# Patient Record
Sex: Male | Born: 1935 | Race: White | Hispanic: No | Marital: Married | Smoking: Former smoker
Health system: Southern US, Community
[De-identification: ages and names within clinical notes are randomized; demographics above are authoritative.]

## PROBLEM LIST (undated history)

## (undated) DIAGNOSIS — F419 Anxiety disorder, unspecified: Secondary | ICD-10-CM

## (undated) DIAGNOSIS — H409 Unspecified glaucoma: Secondary | ICD-10-CM

## (undated) DIAGNOSIS — G510 Bell's palsy: Secondary | ICD-10-CM

## (undated) DIAGNOSIS — I10 Essential (primary) hypertension: Secondary | ICD-10-CM

## (undated) DIAGNOSIS — R310 Gross hematuria: Secondary | ICD-10-CM

## (undated) DIAGNOSIS — F32A Depression, unspecified: Secondary | ICD-10-CM

## (undated) DIAGNOSIS — N401 Enlarged prostate with lower urinary tract symptoms: Secondary | ICD-10-CM

## (undated) DIAGNOSIS — I219 Acute myocardial infarction, unspecified: Secondary | ICD-10-CM

## (undated) DIAGNOSIS — I4891 Unspecified atrial fibrillation: Secondary | ICD-10-CM

## (undated) DIAGNOSIS — K219 Gastro-esophageal reflux disease without esophagitis: Secondary | ICD-10-CM

## (undated) DIAGNOSIS — M4800 Spinal stenosis, site unspecified: Secondary | ICD-10-CM

## (undated) DIAGNOSIS — K449 Diaphragmatic hernia without obstruction or gangrene: Secondary | ICD-10-CM

## (undated) DIAGNOSIS — F329 Major depressive disorder, single episode, unspecified: Secondary | ICD-10-CM

## (undated) HISTORY — DX: Major depressive disorder, single episode, unspecified: F32.9

## (undated) HISTORY — DX: Unspecified atrial fibrillation: I48.91

## (undated) HISTORY — DX: Essential (primary) hypertension: I10

## (undated) HISTORY — DX: Gross hematuria: R31.0

## (undated) HISTORY — DX: Unspecified glaucoma: H40.9

## (undated) HISTORY — DX: Acute myocardial infarction, unspecified: I21.9

## (undated) HISTORY — PX: CYSTOSCOPY WITH FULGERATION: SHX6638

## (undated) HISTORY — PX: TOTAL HIP ARTHROPLASTY: SHX124

## (undated) HISTORY — DX: Bell's palsy: G51.0

## (undated) HISTORY — DX: Benign prostatic hyperplasia with lower urinary tract symptoms: N40.1

## (undated) HISTORY — DX: Gastro-esophageal reflux disease without esophagitis: K21.9

## (undated) HISTORY — DX: Diaphragmatic hernia without obstruction or gangrene: K44.9

## (undated) HISTORY — PX: TONSILLECTOMY: SUR1361

## (undated) HISTORY — PX: BACK SURGERY: SHX140

## (undated) HISTORY — DX: Depression, unspecified: F32.A

## (undated) HISTORY — PX: HEMORRHOID SURGERY: SHX153

## (undated) HISTORY — DX: Spinal stenosis, site unspecified: M48.00

## (undated) HISTORY — DX: Anxiety disorder, unspecified: F41.9

---

## 2006-03-03 ENCOUNTER — Ambulatory Visit (HOSPITAL_COMMUNITY): Admission: RE | Admit: 2006-03-03 | Discharge: 2006-03-03 | Payer: Self-pay | Admitting: Internal Medicine

## 2006-06-04 ENCOUNTER — Encounter: Admission: RE | Admit: 2006-06-04 | Discharge: 2006-06-04 | Payer: Self-pay | Admitting: Nephrology

## 2006-06-11 ENCOUNTER — Encounter: Admission: RE | Admit: 2006-06-11 | Discharge: 2006-06-11 | Payer: Self-pay | Admitting: Nephrology

## 2007-04-17 ENCOUNTER — Ambulatory Visit: Payer: Self-pay | Admitting: Internal Medicine

## 2007-04-30 ENCOUNTER — Encounter: Payer: Self-pay | Admitting: Internal Medicine

## 2007-04-30 ENCOUNTER — Ambulatory Visit: Payer: Self-pay | Admitting: Internal Medicine

## 2007-07-17 ENCOUNTER — Encounter: Admission: RE | Admit: 2007-07-17 | Discharge: 2007-07-17 | Payer: Self-pay | Admitting: Orthopedic Surgery

## 2007-10-08 ENCOUNTER — Inpatient Hospital Stay (HOSPITAL_COMMUNITY): Admission: RE | Admit: 2007-10-08 | Discharge: 2007-10-10 | Payer: Self-pay | Admitting: Specialist

## 2008-02-17 ENCOUNTER — Ambulatory Visit: Payer: Self-pay | Admitting: Oncology

## 2008-02-19 DIAGNOSIS — K644 Residual hemorrhoidal skin tags: Secondary | ICD-10-CM | POA: Insufficient documentation

## 2008-02-19 DIAGNOSIS — F411 Generalized anxiety disorder: Secondary | ICD-10-CM | POA: Insufficient documentation

## 2008-02-19 DIAGNOSIS — K648 Other hemorrhoids: Secondary | ICD-10-CM | POA: Insufficient documentation

## 2008-02-19 DIAGNOSIS — I2510 Atherosclerotic heart disease of native coronary artery without angina pectoris: Secondary | ICD-10-CM | POA: Insufficient documentation

## 2008-02-19 DIAGNOSIS — Z860100 Personal history of colon polyps, unspecified: Secondary | ICD-10-CM | POA: Insufficient documentation

## 2008-02-19 DIAGNOSIS — K5730 Diverticulosis of large intestine without perforation or abscess without bleeding: Secondary | ICD-10-CM | POA: Insufficient documentation

## 2008-02-19 LAB — CBC WITH DIFFERENTIAL/PLATELET
BASO%: 0.4 % (ref 0.0–2.0)
Eosinophils Absolute: 0.2 10*3/uL (ref 0.0–0.5)
MCHC: 34.7 g/dL (ref 32.0–35.9)
MCV: 93.4 fL (ref 81.6–98.0)
MONO%: 7 % (ref 0.0–13.0)
NEUT#: 2.8 10*3/uL (ref 1.5–6.5)
RBC: 3.26 10*6/uL — ABNORMAL LOW (ref 4.20–5.71)
RDW: 14.8 % — ABNORMAL HIGH (ref 11.2–14.6)
WBC: 4.3 10*3/uL (ref 4.0–10.0)

## 2008-02-19 LAB — MORPHOLOGY

## 2008-02-19 LAB — CHCC SMEAR

## 2008-02-19 LAB — ERYTHROCYTE SEDIMENTATION RATE: Sed Rate: 37 mm/hr — ABNORMAL HIGH (ref 0–20)

## 2008-02-24 LAB — IRON AND TIBC
TIBC: 323 ug/dL (ref 215–435)
UIBC: 222 ug/dL

## 2008-02-24 LAB — IMMUNOFIXATION ELECTROPHORESIS
IgG (Immunoglobin G), Serum: 767 mg/dL (ref 694–1618)
IgM, Serum: 228 mg/dL (ref 60–263)
Total Protein, Serum Electrophoresis: 6.5 g/dL (ref 6.0–8.3)

## 2008-02-24 LAB — COMPREHENSIVE METABOLIC PANEL
ALT: 27 U/L (ref 0–53)
AST: 21 U/L (ref 0–37)
Albumin: 4.2 g/dL (ref 3.5–5.2)
Alkaline Phosphatase: 68 U/L (ref 39–117)
BUN: 24 mg/dL — ABNORMAL HIGH (ref 6–23)
Chloride: 103 mEq/L (ref 96–112)
Potassium: 4.4 mEq/L (ref 3.5–5.3)

## 2008-02-24 LAB — FERRITIN: Ferritin: 177 ng/mL (ref 22–322)

## 2008-03-28 LAB — CBC WITH DIFFERENTIAL/PLATELET
Basophils Absolute: 0 10*3/uL (ref 0.0–0.1)
Eosinophils Absolute: 0.2 10*3/uL (ref 0.0–0.5)
HGB: 10.8 g/dL — ABNORMAL LOW (ref 13.0–17.1)
MONO#: 0.3 10*3/uL (ref 0.1–0.9)
NEUT#: 2.8 10*3/uL (ref 1.5–6.5)
RDW: 16.2 % — ABNORMAL HIGH (ref 11.2–14.6)
WBC: 4.5 10*3/uL (ref 4.0–10.0)
lymph#: 1.2 10*3/uL (ref 0.9–3.3)

## 2008-03-28 LAB — COMPREHENSIVE METABOLIC PANEL
AST: 24 U/L (ref 0–37)
Albumin: 4.4 g/dL (ref 3.5–5.2)
BUN: 27 mg/dL — ABNORMAL HIGH (ref 6–23)
Calcium: 9.1 mg/dL (ref 8.4–10.5)
Chloride: 104 mEq/L (ref 96–112)
Glucose, Bld: 72 mg/dL (ref 70–99)
Potassium: 4.7 mEq/L (ref 3.5–5.3)
Total Protein: 7 g/dL (ref 6.0–8.3)

## 2008-04-25 ENCOUNTER — Encounter: Admission: RE | Admit: 2008-04-25 | Discharge: 2008-04-25 | Payer: Self-pay | Admitting: Orthopedic Surgery

## 2008-06-29 ENCOUNTER — Inpatient Hospital Stay (HOSPITAL_COMMUNITY): Admission: RE | Admit: 2008-06-29 | Discharge: 2008-07-05 | Payer: Self-pay | Admitting: Orthopedic Surgery

## 2008-08-05 ENCOUNTER — Ambulatory Visit: Payer: Self-pay | Admitting: Oncology

## 2008-08-09 LAB — COMPREHENSIVE METABOLIC PANEL
ALT: 21 U/L (ref 0–53)
Alkaline Phosphatase: 68 U/L (ref 39–117)
BUN: 13 mg/dL (ref 6–23)
Potassium: 4.4 mEq/L (ref 3.5–5.3)
Sodium: 138 mEq/L (ref 135–145)

## 2008-08-09 LAB — CBC WITH DIFFERENTIAL/PLATELET
Eosinophils Absolute: 0.1 10*3/uL (ref 0.0–0.5)
HCT: 33.9 % — ABNORMAL LOW (ref 38.7–49.9)
HGB: 11.8 g/dL — ABNORMAL LOW (ref 13.0–17.1)
MCHC: 34.8 g/dL (ref 32.0–35.9)
NEUT%: 61.2 % (ref 40.0–75.0)
RBC: 3.55 10*6/uL — ABNORMAL LOW (ref 4.20–5.71)

## 2008-08-09 LAB — LACTATE DEHYDROGENASE: LDH: 124 U/L (ref 94–250)

## 2008-09-23 IMAGING — CR DG HIP 1V PORT*L*
1 series · 1 of 1 positions shown · non-contrast
Comparison: 06/21/2008

CLINICAL DATA: Osteoarthritis of the left hip and.

PORTABLE PELVIS
,

[view not recorded]
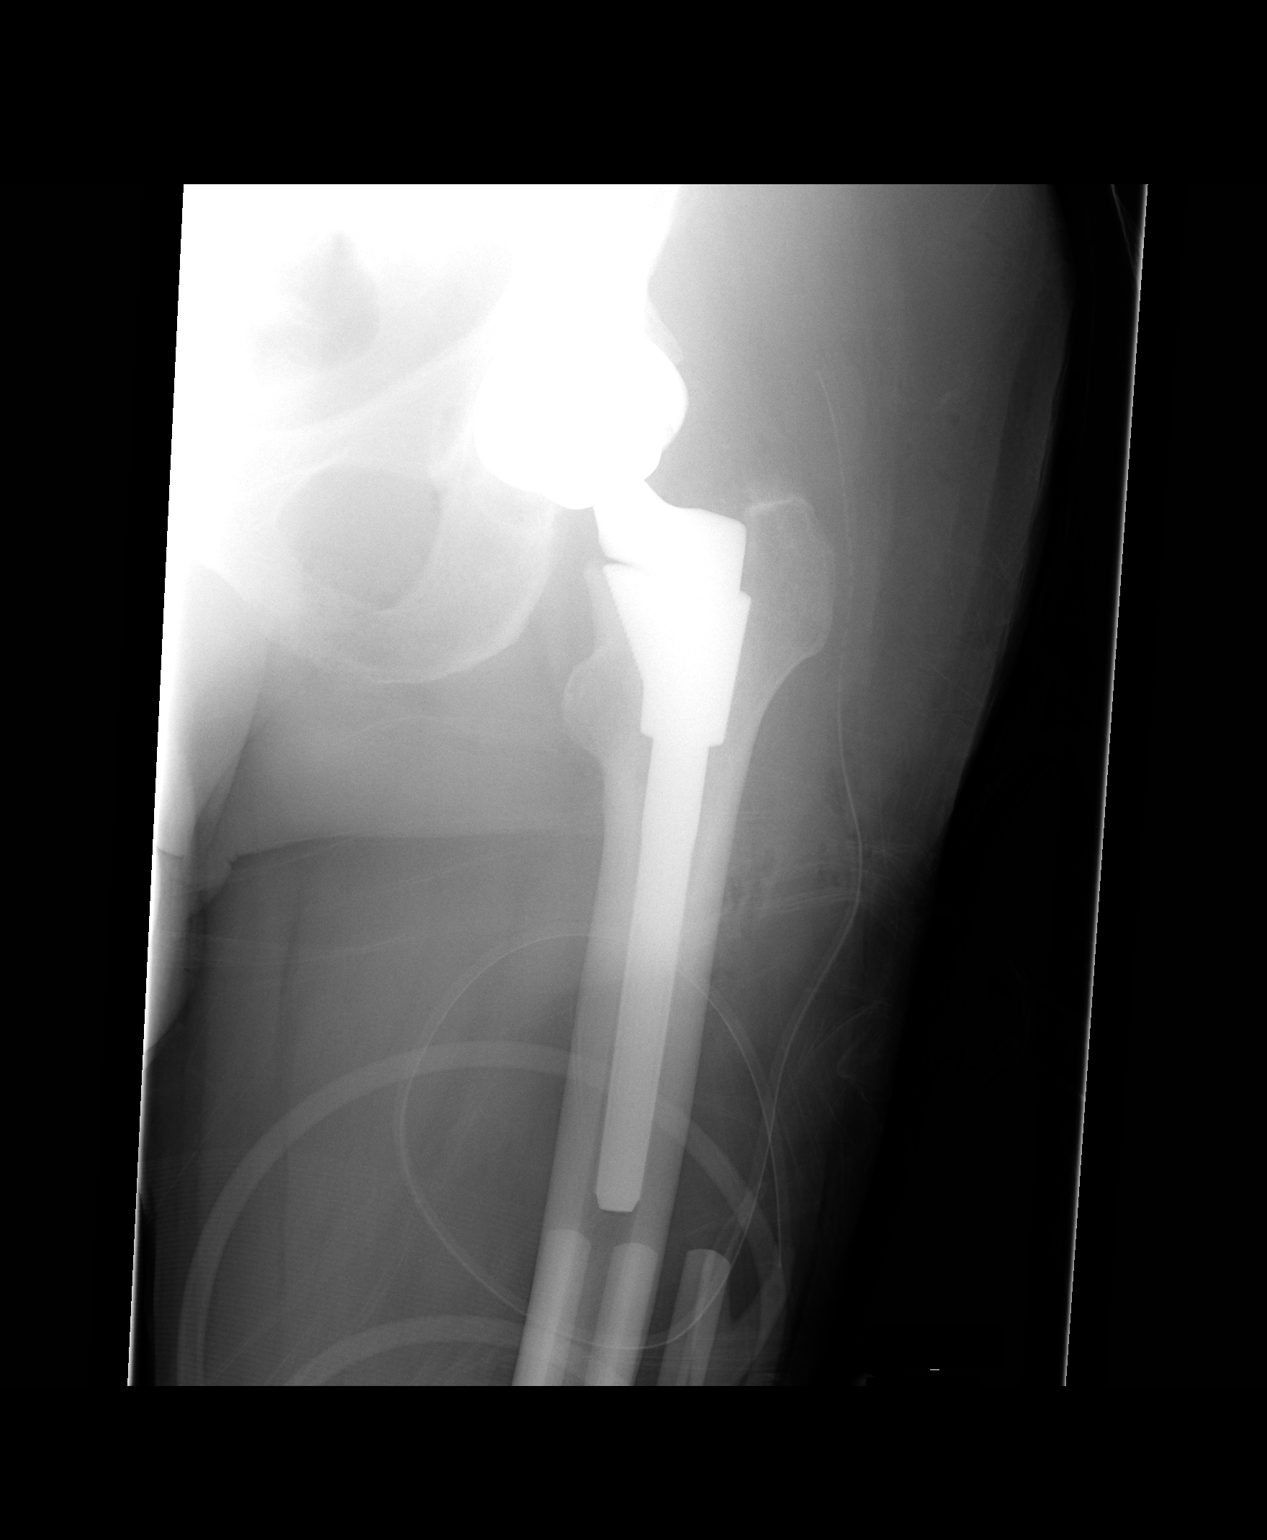

[1 of 1 positions shown; findings below may reference images not displayed]

FINDINGS: The patient has undergone left total hip prosthesis
insertion.  The acetabular and femoral components appear in good
position in the AP projection.  The tip of the stem is not visible
on this radiograph.  Soft tissue drain is noted.

The there has been no change in the appearance of the right total
hip prosthesis.
IMPRESSION: Satisfactory postoperative appearance of the left hip after total
hip replacement.

PORTABLE LEFT HIP - 1 VIEW
FINDINGS: A single AP portable view of the left hip demonstrates
the acetabular and femoral components of the left total hip
prosthesis including the tip of the stem to be in good position.
Soft tissue drain is noted.
IMPRESSION: Satisfactory postoperative appearance of the left hip after total
hip replacement.  (AP projection only).

## 2009-02-02 ENCOUNTER — Ambulatory Visit: Payer: Self-pay | Admitting: Oncology

## 2009-02-14 LAB — LACTATE DEHYDROGENASE: LDH: 131 U/L (ref 94–250)

## 2009-02-14 LAB — CBC WITH DIFFERENTIAL/PLATELET
BASO%: 0.5 % (ref 0.0–2.0)
Basophils Absolute: 0 10*3/uL (ref 0.0–0.1)
Eosinophils Absolute: 0.2 10*3/uL (ref 0.0–0.5)
MCHC: 34.6 g/dL (ref 32.0–36.0)
MONO%: 7.1 % (ref 0.0–14.0)
NEUT%: 59.7 % (ref 39.0–75.0)
Platelets: 110 10*3/uL — ABNORMAL LOW (ref 140–400)
WBC: 4.2 10*3/uL (ref 4.0–10.3)

## 2009-02-14 LAB — COMPREHENSIVE METABOLIC PANEL
ALT: 29 U/L (ref 0–53)
AST: 27 U/L (ref 0–37)
Albumin: 4.6 g/dL (ref 3.5–5.2)
BUN: 16 mg/dL (ref 6–23)
CO2: 25 mEq/L (ref 19–32)
Chloride: 105 mEq/L (ref 96–112)
Glucose, Bld: 87 mg/dL (ref 70–99)
Potassium: 4.4 mEq/L (ref 3.5–5.3)
Total Protein: 6.8 g/dL (ref 6.0–8.3)

## 2010-08-04 ENCOUNTER — Emergency Department (HOSPITAL_COMMUNITY): Admission: EM | Admit: 2010-08-04 | Discharge: 2010-08-04 | Payer: Self-pay | Admitting: Emergency Medicine

## 2010-11-27 ENCOUNTER — Ambulatory Visit (HOSPITAL_BASED_OUTPATIENT_CLINIC_OR_DEPARTMENT_OTHER)
Admission: RE | Admit: 2010-11-27 | Discharge: 2010-11-27 | Disposition: A | Discharge status: Home / Self Care | Payer: Medicare Other | Source: Ambulatory Visit | Attending: Urology | Admitting: Urology

## 2010-11-27 ENCOUNTER — Ambulatory Visit (HOSPITAL_COMMUNITY): Payer: Medicare Other

## 2010-11-27 ENCOUNTER — Other Ambulatory Visit: Payer: Self-pay | Admitting: Urology

## 2010-11-27 DIAGNOSIS — C674 Malignant neoplasm of posterior wall of bladder: Secondary | ICD-10-CM | POA: Insufficient documentation

## 2010-11-27 LAB — BASIC METABOLIC PANEL
GFR calc Af Amer: 56 mL/min — ABNORMAL LOW (ref >60)
GFR calc non Af Amer: 46 mL/min — ABNORMAL LOW (ref >60)

## 2010-11-27 LAB — CBC
HCT: 39.9 % (ref 39.0–52.0)
MCH: 33.5 pg (ref 26.0–34.0)
MCHC: 34.6 g/dL (ref 30.0–36.0)
MCV: 96.8 fL (ref 78.0–100.0)
RBC: 4.12 MIL/uL — ABNORMAL LOW (ref 4.22–5.81)
WBC: 5.7 10*3/uL (ref 4.0–10.5)

## 2010-12-02 NOTE — Op Note (Signed)
  NAMEJORDY, Omar Khan               ACCOUNT NO.:  [id number]  MEDICAL RECORD NO.:  [id number]         PATIENT TYPE:  HAMB  LOCATION:                               FACILITY:  [facility]  PHYSICIAN:  Courtney Paris, M.D.DATE OF BIRTH:  08-27-1925  DATE OF PROCEDURE:  11/27/2010 DATE OF DISCHARGE:                              OPERATIVE REPORT   PREOPERATIVE DIAGNOSIS:  Papillary bladder tumor, right posterior wall (T1A).  POSTOPERATIVE DIAGNOSIS:  Papillary bladder tumor, right posterior wall (T1A), pending path report.  OPERATION:  Transurethral resection of bladder tumor (3 cm).  ANESTHESIA:  General.  SURGEON:  Courtney Paris, M.D.  BRIEF HISTORY:  This 77 year old patient presented with gross hematuria for the first time in October 2011 and has had intermittent bleeding until January 2012.  Cystoscopy showed a papillary tumor about 2.5-3 cm of the right base and he enters now to have this resected.  CT scan showed normal upper tracts.  He does have bilateral hip prosthesis.  He has been a nonsmoker since 1964.  DESCRIPTION OF PROCEDURE:  The patient was placed on the operating table in dorsal lithotomy position.  After satisfactory induction of general anesthesia, he was prepped and draped with Betadine in the usual sterile fashion.  Time-out was then performed and the patient and the procedure were then reconfirmed.  Because of his bilateral hip prosthesis, the grounding pad for the Bovie was placed on his abdomen.  The 28 continuous flow resectoscope was inserted in the bladder, and the bladder was carefully inspected. The papillary tumor in the right posterior wall was photographed and then resected carefully with the loop.  The chips were evacuated from the bladder and the base was fulgurated.  Picture was taken of the post TURBT site.  The scope was removed, but there was a little bit of bleeding from the prostatic urethra which was quite hyperemic.  An 18  Foley catheter was inserted and the irrigant was clear.  Little bit of oozing coming out around the penis was noted.  Catheter was left to straight drainage and attached to a leg bag.  He was taken to the recovery room in good condition and will be later discharged as an outpatient.     Courtney Paris, M.D.     HMK/MEDQ  D:  11/27/2010  T:  11/27/2010  Job:  [id number]  Electronically Signed by Vic Blackbird M.D. on 11/29/2010 12:57:23 PM

## 2011-01-02 LAB — URINALYSIS, ROUTINE W REFLEX MICROSCOPIC
Glucose, UA: NEGATIVE mg/dL
Ketones, ur: 15 mg/dL — AB

## 2011-01-02 LAB — URINE MICROSCOPIC-ADD ON

## 2011-01-02 LAB — HEMOCCULT GUIAC POC 1CARD (OFFICE): Fecal Occult Bld: NEGATIVE

## 2011-03-05 NOTE — Discharge Summary (Signed)
Omar Khan, Omar Khan               ACCOUNT NO.:  [id number]   MEDICAL RECORD NO.:  [id number]          PATIENT TYPE:  INP   LOCATION:  1610                         FACILITY:  [facility]   PHYSICIAN:  Ollen Gross, M.D.    DATE OF BIRTH:  [birth year]   DATE OF ADMISSION:  06/29/2008  DATE OF DISCHARGE:  07/05/2008                               DISCHARGE SUMMARY   ADMITTING DIAGNOSES:  1. Osteoarthritis, left hip.  2. Anxiety.  3. Shingles.  4. Past history of aspiration pneumonia  5. Hypertension.  6. Coronary arterial disease.  7. History of myocardial infarction in 1985.  8. Hiatal hernia.  9. Reflux disease.  10.Hypercholesterolemia.  11.Hemorrhoids.  12.Past history of transfusion.  13.Spinal stenosis.  14.Childhood illnesses of measles, mumps and rubella.   DISCHARGE DIAGNOSES:  1. Osteoarthritis, left hip status post left total hip replacement      arthroplasty.  2. Acute renal failure postoperative, improved.  3. Postoperative blood loss anemia.  4. Status post transfusion without sequelae.  5. Urinary tract infection.  6. Gross hematuria following catheter removal, improving.  7. Anxiety.  8. Shingles.  9. Past history of aspiration pneumonia  10.Hypertension.  11.Coronary arterial disease.  12.History of myocardial infarction in 1985.  13.Hiatal hernia.  14.Reflux disease.  15.Hypercholesterolemia.  16.Hemorrhoids.  17.Past history of transfusion.  18.Spinal stenosis.  19.Childhood illnesses of measles, mumps and rubella.   PROCEDURE:  June 29, 2008, left total hip, Ollen Gross, M.D. with  assistant Alexzandrew L. Perkins, P.A.C.  Anesthesia, spinal.   CONSULTS:  Kari Baars, M.D., medical.   BRIEF HISTORY:  Mr. Nakama is an 77 year old male with severe end-stage  arthritis of the left hip with progressive worsening pain and  dysfunction, failed nonoperative management who now presents for total  hip arthroplasty.   LABORATORY DATA:  Preop CBC  showed hemoglobin low at 10.6, hematocrit  31.5, white cell count 4, platelets 114.  Chem panel minimally elevated  BUN and creatinine 30 and 1.75.  Remaining chem panel within normal  limits.  Preop UA, small bili, otherwise negative.  Serial CBCs were  followed.  The patient did receive 2 units of blood intraoperatively and  the post hemoglobin was stable at 10.5, but drifted down to 8.3.  He  felt like he needed more blood, given two more units.  It came back 10.1  Last H and H 9.6 and 28.3.  Serial pro time followed per Coumadin  protocol.  Last PT/INR of 33.3 and 3.  Serial BMET were followed.  He  had minimally elevated BUN and creatinine preoperatively.  BUN and  creatinine on postop day #1 was normal, but however, he shot up and his  BUN went up to 29 and then got as high as 33, back down to 20.  His  creatinine went from a normal level of 1.29 up to 3.14.  On postop day  #2, rechecked, it got as high as 3.43, came back down to 3, improving in  went back down to a normal level 1.13 prior to discharge.   Preop chest x-ray dated October 06, 2007, no acute findings.  Postop  chest x-ray July 02, 2008, no infiltrate or congestive heart  failure, questioned confluence of shadows peripherally aspect, right  midlung versus underlying nodule, two-view chest recommended.  Follow-up  portable pelvis and hip, satisfactory appearance of left hip after total  hip replacement.  EKG dated July 01, 2008, normal sinus rhythm, ST  and T-wave abnormality, consider inferior, superior anterior lateral  ischemia and no change since last confirmed by Caryn Bee E. Denton Lank, M.D.  Preop chest x-ray was dated October 07, 2007, normal sinus rhythm, left  ventricular hypertrophy, repolarization abnormality, normal tracing when  compared by Nicki Guadalajara, M.D..   HOSPITAL COURSE:  The patient was admitted to [facility].  He  tolerated the procedure well, later transferred to recovery room and  the  orthopedic floor.  Started on PCA and p.o. analgesic pain control  following surgery.  He did receive 2 units of blood intraoperatively.  Started out with a low hemoglobin.  He was actually doing pretty well on  morning of day one.  He had decent output.  Hemovac drain placed at the  time of surgery was pulled.  Put him on slight precautions because he  had a previous aspiration pneumonia following a previous surgery.  So we  encouraged him to sit upright for meals.  Blood pressure medications  were resumed, placed on parameters.  Added sublingual nitro due to his  history of MI, but that was many years ago.  Started him back on his  home meds, started getting up and walking short distances by day two.  Unfortunately, he had developed some azotemia with increase in BUN and  creatinine.  Kari Baars, M.D. was consulted by Ollen Gross, M.D.  and felt to have some acute renal failure developing, likely prerenal  azotemia with a drop and associated high blood pressure.  Gave him blood  a second time because his hemoglobin was back down on day two so he got  2 more units of blood.  Blood pressure medications were placed on hold  with parameters and given fluids.  His hemoglobin improved following the  blood and went up to 10.1.  His BUN and creatinine got worse initially  and got as high as 30 and 3.4 noted on postop day #2 and 33 and 3 on  postop day #3.  He was seen throughout the weekend and renal function  was monitored.  After postop day #3 though his renal function started to  improve with the management of his hypotension and also fluid  administration.  By postop day #4, his BUN and creatinine was back down  to 27 and 1.7.  He felt like he had turned the corner and  started  getting up with physical therapy.  He was progressing with physical  therapy, but it was felt that he possibly could benefit undergoing a  skilled nursing facility prior to discharge.  From medical  standpoint he  improved through the weekend and on postop day #5 he was doing better.  Hemoglobin was stabilized at 9.6.  Incision looked good.  We got  discharge planning social worker involved to assist with placement of  the patient.  We left his Foley catheter in for several days for strict  monitoring of his input and output.  At the end of the weekend though  his BUN and creatinine had improved and we removed the Foley.  He had  developed some gross hematuria, felt to  be possibly due to a traumatic  cath now that the Foley was removed.  Weekend coverage spoke with the  urologist on-call and suggested monitoring and make sure that it did  improve, if it did not the recommendation was to reinsert the Foley for  irrigation of the bladder.  His gross hematuria did improve though.  The  color improved over the next day or two.  UA was checked and was found  to have positive for beginnings of urinary tract infection.  That was  noted on Tuesday, so we put him on some antibiotics for that.  He  continued to improve.  He was seen on Tuesday by medical services, W.  Buren Kos, M.D. who felt the patient was stable and if a bed was  available to transfer him over.  We put him  antibiotics for UTI.  He  was progressing with therapy and felt to be a good candidate and was  transferred at that time as long as a bed was available.   DISCHARGE/PLAN:  1. The patient will be transferred to a skilled nursing facility of      choice on July 05, 2008.  2. Discharge diagnoses, please see above.  3. Discharge meds, current medications include:      a.     Coumadin protocol although his Coumadin is on hold today of       July 05, 2008, but resuming on July 06, 2008, please       titrate the Coumadin level for target INR between 2 and 3.  He       needs to be on Coumadin for 3 weeks from his total knee protocol.       Again, his Coumadin is on hold today July 05, 2008 because of        his INR is 3, but please titrate the Coumadin level.      b.     Colace 100 mg p.o. b.i.d.      c.     Crestor 20 mg daily.      d.     Labetalol 200 mg p.o. b.i.d. hold if systolic pressure is       less than 140.      e.     Benicar 40 mg p.o. daily hold if systolic pressure is less       than 130.      f.     Hydrochlorothiazide 25 mg daily.      g.     Paxil 20 mg nightly.      h.     Xanax 0.5 mg p.o. nightly.      i.     Prilosec 20 mg daily.      j.     Nu-Iron 150 mg p.o. daily.      k.     Albuterol nebulizers b.i.d.      l.     Percocet 5 mg one or two every 4 hours as needed for pain       p.r.n.      m.     Tylenol 325 one or two every 4-6 hours as needed for pain       except for headache p.r.n. n. Cipro 500 mg p.o. b.i.d. for 7 days.       He was to start it today on July 05, 2008.  4. Diet:  Heart healthy cardiac diet, low cholesterol.  5. Follow-up:  He needs to  follow up with Ollen Gross, M.D. in the      office 2 weeks from the date of surgery.  Please contact the office      at [phone] to arrange appointment time and transfer of the      patient.  6. The patient is a follow-up with W. Buren Kos, M.D. over at      [facility] in 1 week from discharge from the skilled nursing      facility.  Please have the patient follow-up with Dr. Clelia Croft 1 week      after leaving the skilled nursing facility of choice.  7. Activity:  The patient is partial weightbearing 25-50% to the left      lower extremity.  Hip precautions, total hip protocol, gait      training, ambulation, ADLs as per PT and OT.  May start showering      however, do not submerge the incision under water.   DISPOSITION:  Skilled nurse facility awaiting final bed offer.   CONDITION ON DISCHARGE:  Improved.      Alexzandrew L. Perkins, P.A.C.      Ollen Gross, M.D.  Electronically Signed    ALP/MEDQ  D:  07/05/2008  T:  07/05/2008  Job:  [id number]   cc:   Kari Baars, M.D.   Fax: [phone]

## 2011-03-05 NOTE — Consult Note (Signed)
[name], [name]:  [id number]   MEDICAL RECORD NO.:  [id number]          PATIENT TYPE:  INP   LOCATION:  1610                         FACILITY:  [facility]   PHYSICIAN:  Kari Baars, M.D.  DATE OF BIRTH:  December 20, 1924   DATE OF CONSULTATION:  DATE OF DISCHARGE:                                 CONSULTATION   REASON FOR CONSULTATION:  Acute renal failure and hypotension following  a left total hip replacement.   HISTORY OF PRESENT ILLNESS:  [name] is an 77 year old white male with  a history of coronary artery disease status post MI (1985),  hypertension, chronic renal insufficiency with a baseline creatinine of  1.8, and hyperlipidemia who was admitted for an elective left total hip  replacement on September 9 due to severe hip pain related to  osteoarthritis.  He tolerated the procedure well with no initial  perioperative complications.  Over the past 24 hours, he had seen a  decrease in his blood pressure down to 80/57 associated with a decrease  in urine output and increase in his creatinine from 1.29 yesterday to  3.14 today.  In addition, his hemoglobin has dropped from 10.6  preoperatively to 8.3 today.  He denies any chest pain, shortness of  breath, fevers, chills or sweats.  He has not had any apparent bleeding.  His pain has been adequately controlled with morphine PCA and actually  ambulated briefly yesterday.  Of note, the patient does have a baseline  creatinine of 1.8 which has been longstanding and relatively stable.  He  has seen Dr. Briant Cedar due to a brief rise of his creatinine in the past  to above 2.  He was instructed to hold his Diovan HCT for 2 days  preoperatively.  This was restarted postoperatively in addition to his  other home medications.   REVIEW OF SYSTEMS:  All systems reviewed with the patient and are  negative except as in HPI.   PAST MEDICAL HISTORY:  1. Coronary artery disease status post MI in 1985 (abnormal  EKG).  2. Hypertension.  3. Impaired fasting glucose .  4. Hyperlipidemia.  5. Chronic renal insufficiency with baseline creatinine 1.8.  6. Anemia of chronic disease.  7. Bell's palsy on the right.  8. Anxiety.  9. Gastroesophageal reflux disease.  10.Status post right total hip replacement (2003) and oriental.  11.Spinal stenosis status post lumbar laminectomy in December 2008 by      Dr. Shelle Iron.   MEDICATIONS:  1. Crestor 20 mg daily.  2. Labetalol 200 mg b.i.d.  3. Prilosec OTC 20 mg daily.  4. Diovan/HCT 320/25 daily - held on June 27, 2008.  5  Alprazolam 0.5 mg daily.  1. Paxil 20 mg daily.  2. Aspirin 325 mg daily.  3. B12 100 mcg p.o. daily.  4. Fish oil daily.   ALLERGIES:  NO KNOWN DRUG ALLERGIES.   SOCIAL HISTORY:  He is married.  He is retired and a remote smoker.  One  drink per day.   FAMILY HISTORY:  Father died of coronary artery disease.   PHYSICAL EXAMINATION:  VITAL SIGNS:  Temperature 98.6, pulse 88,  respirations 14-16, blood pressure 80-107/49-62, oxygen saturation 95%  and 2 liters.  Ins and outs for the past 24 hours 2254 mL and 750 mL  out.  He has only had 75 mL of urine output recorded since midnight.  He  has only had 75 mL of urine output recorded in the past 8 hours.  GENERAL:  Pale appearing, but pleasant, in no acute distress.  HEENT:  He has a chronic right facial droop.  Pupils are symmetric.  Oropharynx is moist.  NECK:  Supple without lymphadenopathy, JVD or carotid bruits.  HEART:  Regular rate and rhythm without murmurs, rubs or gallops.  LUNGS:  Clear to auscultation bilaterally.  ABDOMEN:  Soft, nondistended, nontender with normoactive bowel sounds.  EXTREMITIES:  Trace left lower extremity edema.  STDs in place.  Left  hip incision is dressed with no apparent bleeding.   LABORATORY DATA:  White count of 5.6, hemoglobin 8.3, platelets 89,  preop hemoglobin was 10.6 on September 1.  B-met shows a sodium 138,  potassium 4.1,  chloride 107, bicarb 26, BUN 27, creatinine 3.1, glucose  135.  Preop BUN and creatinine were 30 and 1.75, respectively.  INR 1.4.   ASSESSMENT/PLAN:  1. Acute renal failure.  This is likely due to prerenal azotemia/ATN      due to hypotension and acute blood loss in the setting of his      antihypertensive therapy with ACE inhibitors and HCT.  Will hold      all his antihypertensive medications and allow his blood pressure      to recover with a goal of around 134 adequate renal perfusion.  I      agree with IV fluid bolus and transfusion which has been ordered by      Orthopedics.  Will monitor his ins and outs strictly and hope for      recovery of his renal function as his blood pressure improves.  I      doubt he has had cardiac event, but will obtain an EKG and rule out      myocardial infarction.  2. Hypotension.  Likely due to acute blood loss and pain medications      as well as his antihypertensive therapy.  At home, he tends to run      on the lower side of normal between 100 and 110.  Will hold his      Labetalol and Diovan/HCTZ for now and resume his blood labetalol      first as his blood pressure recovers.  Once his renal function has      returned to normal, we will restart his ARB and      hydrochlorothiazide.  As stated, his blood pressure goal will be      around 130/80 at this point.  3. Anemia.  Multifactorial due to anemia of chronic disease      preoperatively and acute blood loss anemia.  His preop anemia is      likely related to his chronic renal insufficiency.  Will add an      iron supplement and Aranesp per pharmacy protocol to promote      recovery to above 10.  4. Thrombocytopenia.  Postoperative drop in platelets.  Note, he is      not on Lovenox or heparin.  This is likely related to blood loss.      Will monitor.  5. Status post left total hip  replacement.  Postoperative management      per Orthopedics.  He is on Coumadin and SCDs for DVT prophylaxis       per their protocol.   DISPOSITION:  I anticipate he will be able to be discharged home once #1  and #2 are stable.   I appreciate the opportunity to participate in the care of Mr. [name].  We will follow his medical issues closely during this  hospitalization and help with transition home.  Appreciate Orthopedics  management.      Kari Baars, M.D.  Electronically Signed     WS/MEDQ  D:  07/01/2008  T:  07/01/2008  Job:  [id number]   cc:   Dyke Maes, M.D.  Fax: [phone]   Ollen Gross, M.D.  Fax: [phone]

## 2011-03-05 NOTE — Op Note (Signed)
NAMEJEVANTE, HOLLIBAUGH               ACCOUNT NO.:  [id number]   MEDICAL RECORD NO.:  [id number]          PATIENT TYPE:  INP   LOCATION:  NA                           [facility]ACILITY[facility]   PHYSICIAN:  Ollen Gross, M.D.    DATE OF BIRTH:  [birth year]   DATE OF PROCEDURE:  06/29/2008  DATE OF DISCHARGE:                               OPERATIVE REPORT   PREOPERATIVE DIAGNOSIS:  Osteoarthritis left hip.   POSTOPERATIVE DIAGNOSIS:  Osteoarthritis left hip.   PROCEDURE:  Left total hip arthroplasty.   SURGEON:  Ollen Gross, M.D.   ASSISTANT:  Avel Peace PA-C.   ANESTHESIA:  Spinal.   BLOOD LOSS:  400.   DRAIN:  Hemovac x1.   COMPLICATIONS:  None.   CONDITION:  Stable to recovery.   CLINICAL NOTE:  Mr. Kucher is an 77 year old male who has severe end-  stage osteoarthritis of the left hip with progressively worsening pain  and dysfunction.  He has failed nonoperative management and presents now  for total hip arthroplasty.   PROCEDURE IN DETAIL:  After the successful administration of spinal  anesthetic, the patient was placed in the right lateral decubitus  position with the left side up and held with the hip positioner.  The  left lower extremity was isolated from his perineum with plastic drapes  and prepped and draped in the usual sterile fashion.  A short  posterolateral incision was made with a 10 blade through the  subcutaneous tissue to the level of fascia lata which was incised in  line with the skin incision.  Sciatic nerve was palpated and protected  and the short external rotators isolated off the femur.  Capsulectomy  was performed and the hip was dislocated.  The center of the femoral  head was marked and the trial prosthesis was placed such that the center  of the trial head corresponds to the center of his native femoral head.  Osteotomy lines marked on the femoral neck and osteotomy made with an  oscillating saw.  Femoral head was removed and the femur  retracted  anteriorly to gain acetabular exposure.   Acetabular retractors were placed and labrum and osteophytes removed.  Reaming starts at 45 mm, coursing increments of 2-53 mm, then a 54-mm  pinnacle acetabular shell was placed in anatomic position and transfixed  with a dome screw.  A Trial 36-mm neutral +4 liner was placed.   The femur was prepared with the canal finder and irrigation.  Axial  reaming was performed to 13.5 mm, proximal reaming to an 64F and the  sleeve machine to a large.  An 64F large trial sleeve was placed and an  18 x 13 stem and a 36+ 8 neck about 10 degrees beyond native  anteversion.  A 36+ 0 head was placed.  The hip was reduced with great  stability.  There was full extension and full external rotation, 70  degrees of flexion, 40 degrees adduction and about 70 degrees internal  rotation and 90 degrees of flexion, 70 degrees of internal rotation.  By  placing the left leg on top  of the right, it felt as though the leg  lengths were equal.  The hip was then dislocated and trials removed.  The permanent apex hole eliminator was placed in the acetabular shell  and the permanent 36 mm neutral +4 marathon liner was placed.  On the  femoral side, we placed the 45F large sleeve with the 18 x 13 stem and a  36+ 8 neck about 10 degrees beyond native anteversion.  A 36+ 0 head was  placed and the hip was reduced with the same stability parameters.  The  wound was copiously irrigated with saline solution and the short  rotators reattached to the femur through drill holes.  The fascia lata  was closed over a Hemovac drain with interrupted #1 Vicryl, subcu closed  with #1 and #2-0 Vicryl and subcuticular running 4-0 Monocryl.  The  incisions were cleaned and dried and Steri-Strips and a bulky sterile  dressing applied.  He is placed into a knee immobilizer, awakened and  transported to recovery in stable condition.      Ollen Gross, M.D.  Electronically  Signed     FA/MEDQ  D:  06/29/2008  T:  06/30/2008  Job:  [id number]

## 2011-03-05 NOTE — H&P (Signed)
Omar Khan, BUCHHEIT NO.:  [id number]   MEDICAL RECORD NO.:  [id number]          PATIENT TYPE:  INP   LOCATION:  NA                           FACILITY:  [facility]   PHYSICIAN:  Ollen Gross, M.D.    DATE OF BIRTH:  [birth year]   DATE OF ADMISSION:  06/29/2008  DATE OF DISCHARGE:                              HISTORY & PHYSICAL   DATE OF OFFICE VISIT/HISTORY AND PHYSICAL:  May 26, 2008   CHIEF COMPLAINT:  Left hip pain.   HISTORY OF PRESENT ILLNESS:  The patient is an 77 year old male who has  been seen by Dr. Lequita Halt for ongoing left hip pain. He is noted to have  end-stage arthritis that is progressively getting worse.  It is felt he  would benefit from undergoing surgical intervention.  Risks and benefits  discussed.  The patient is subsequently admitted to the hospital.  The  patient has been seen preoperatively by Dr. Clelia Croft and felt to be stable  for surgery.   ALLERGIES:  None.   CURRENT MEDICATIONS:  Labetalol Crestor, Diovan, paroxetine, alprazolam,  hydrocodone, B12, enteric-coated aspirin, chromium, Metamucil,  omeprazole, fish oil.   PAST MEDICAL HISTORY:  1. Anxiety.  2. Shingles.  3. Past history of aspiration pneumonia following a previous right      total hip.  4. Hypertension.  5. Coronary arterial disease.  6. History of myocardial infarction 1985.  7. Hiatal hernia.  8. Reflux disease.  9. Hypercholesterolemia.  10.Hemorrhoids.  11.Past history of transfusion.  12.Spinal stenosis.  13.Childhood illnesses to include measles, mumps and rubella.   PAST SURGICAL HISTORY:  Tonsils, adenoids.  He has had four cardiac  catheterizations, hemorrhoid surgery, right hip replacement in November  2003, which he unfortunately sustained a postop aspiration pneumonia and  was in the ICU, and also back surgery.   SOCIAL HISTORY:  Married, retired Airline pilot.  Past smoker for about 20  years, quit about 45 years ago.  Alcohol:  2 ounces of alcohol  before  dinner. Two children.   FAMILY:  Wife will be assisting with his care after surgery.   FAMILY HISTORY:  Father deceased age 70 secondary to heart.  Mother  deceased age 51, unknown causes.  Sister deceased age 2, lung cancer.   REVIEW OF SYSTEMS:  GENERAL:  No fevers, chills, night sweats.  NEURO:  He does have a little bit of hearing loss in the right ear.  No  seizures, syncope or paralysis.  RESPIRATORY: No shortness breath, cough  or hemoptysis.  CARDIOVASCULAR:  No chest pain, no orthopnea.  GI: No  nausea, vomiting, diarrhea or constipation.  GU: Little bit of nocturia.  No dysuria, hematuria.  MUSCULOSKELETAL:  Hip pain.   PHYSICAL EXAM:  VITAL SIGNS:  Pulse 60, respirations 14, blood pressure  100/52.  GENERAL:  77 year old white male, short-statured, in no acute distress.  He is alert, oriented and cooperative.  HEENT: Normocephalic, atraumatic.  Pupils are round and reactive.  EOMs  intact.  Little bit of very slight drooping on the right face noted  preoperatively.  NECK:  Supple.  CHEST: Clear.  HEART: Regular rate and rhythm with a 2/6 pulmonic area systolic  ejection murmur.  ABDOMEN: Soft, nontender.  Bowel sounds present.  RECTAL/BREASTS/GENITALIA:  Not done, not pertinent to present illness.  EXTREMITIES:  Left hip flexion 90, zero internal rotation, 10 degrees  external rotation and 10 degrees abduction.  Crepitus noted.   IMPRESSION:  Osteoarthritis left hip.   PLAN:  The patient is admitted to [facility] to undergo a left  total hip replacement arthroplasty.  Surgery will be performed by Dr.  Ollen Gross.      Alexzandrew L. Perkins, P.A.C.      Ollen Gross, M.D.  Electronically Signed    ALP/MEDQ  D:  06/28/2008  T:  06/29/2008  Job:  [id number]   cc:   Ollen Gross, M.D.  Fax: [phone]   Clelia Croft Dr. ?

## 2011-03-05 NOTE — Op Note (Signed)
Omar Khan, Omar Khan               ACCOUNT NO.:  [id number]   MEDICAL RECORD NO.:  [id number]          PATIENT TYPE:  AMB   LOCATION:  DAY                          FACILITY:  [facility]   PHYSICIAN:  Jene Every, M.D.    DATE OF BIRTH:  1925-08-09   DATE OF PROCEDURE:  10/08/2007  DATE OF DISCHARGE:                               OPERATIVE REPORT   PREOPERATIVE DIAGNOSIS:  Spinal stenosis 4-5, 5-1.   POSTOPERATIVE DIAGNOSIS:  Spinal stenosis 4-5, 5-1, L3-4.   PROCEDURE PERFORMED:  Central decompression, L3-4, L4-5, and L5-S1 by  laminectomies of L4, L5, foraminotomies of L4-L5.   ANESTHESIA:  General.   SURGEON:  Dr. Shelle Iron   ANESTHESIA:  General.   ASSISTANT:  Roma Schanz, PA   BRIEF HISTORY AND INDICATIONS:  An 77 year old with neurogenic  claudication secondary to spinal stenosis at increased lumbosacral  angle, severe stenosis at 4-5, multifactorial, at 5-1 was noted to be  epidural lipomatosis.  So he came for decompression of 4-5 and 5-1,  possibly of 3-4.  The risks and benefits discussed including bleeding,  infection, damage to vascular structures, CSF leakage, epidural  fibrosis, A.C. segment disease, need for fusion in the future,  anesthetic complications, etc.   TECHNIQUE:  The patient in supine position.  After the induction of  adequate general anesthesia and 2 g of Kefzol, he was placed prone on  the Andrews frame with the hip in 90/90 position, preserving the  location of the patient's hip.  Lumbar region prepped and draped in the  usual sterile fashion.  Incision was made from the spinous process of 3  to S1.  Subcutaneous tissue was dissected.  Electrocautery was utilized  to achieve hemostasis.  Dorsolumbar fascia identified and divided in  line with the skin incision.  Paraspinous muscle elevated from the  lamina of 4 and 5 and partially at 3 to the sacrum.  Kochers were placed  on the spinous processes of 4 and 5.  This confirmed with x-ray, and  both spinous processes were removed with a Leksell rongeur.  Due to the  increased lumbosacral angle and the severe stenosis of 4-5, we started  at the interspace of 5-1, performed the hemi-laminotomy of the caudad  edge of 5 with a 2 mm Kerrison, detaching the ligamentum flavum.  I  placed a neural patty beneath the neural arch.  I then proceeded to  remove the neural arch at 5 with a 2 mm Kerrison in piecemeal fashion.  Noted at 5-1 was epidural lipomatosis.  Ligamentum flavum was removed  from the interspace here.  The lateral recesses were slightly  hypertrophic, and ligamentum flavum was removed here as well.  The disk  was without herniation.  In a similar fashion on the caudad edge of 4  bilaterally, a 2 mm Kerrison was utilized to remove the lamina of 4  first centrally and then laterally with neural patties placed to protect  the neural elements.  There was severe hypotrophic ligamentum flavum and  stenosis noted.  This required the complete removal of  lamina of 4.  There was  hypertrophic ligamentum noted at 3-4 as well and shingling of  3, 4 vertebral lamina.  The operating microscope had been draped and  brought onto the surgical field, and we decompressed the 4-5 space out  from the 4 pedicle under-cutting the facets, removing the hypertrophic  ligamentum flavum.  Severe stenosis noted particularly at this level.  This was relieved with the decompression.  The hypertrophic ligamentum  then was removed from 3-4 as we continued the decompression cephalad.  Following this, hockey-stick probe passed freely into the neural arch at  3 and now the foramen of S1 distally.  Then finished the decompression  to the lateral recess of the 3 and 4 pedicle utilizing high-powered  scope.  Down the disk of 3-4, 4-5, and 5-1 with no specific disk  herniation.  Bipolar electrocautery was utilized to achieve hemostasis  throughout the case.  The probe then passed freely to the foramen of 4  and 5  and S1 following decompression.  There was good restoration of the  thecal sac noted.  No evidence of CSF leakage or active bleeding.  The  wound was copiously irrigated and again inspected, no evidence of  residual stenosis was noted.  After copious irrigation, placed FloSeal  in the lamina defect.  Excellent hemostasis was obtained.  Removed the  McCullough retractor, irrigated the subcutaneous tissues and the fascia,  repaired the fascia with #1 Vicryl interrupted figure-of-eight sutures.  Prior to that, Hemovac was placed and brought out to the lateral skin.  Subcutaneous tissue reapproximated with 2-0 Vicryl simple sutures.  Skin  was reapproximated with staples.  Wound was dressed sterilely.  He was  placed supine on the hospital bed, extubated without difficulty, and  taken care of the operative hip, and transported to the recovery room in  satisfactory condition.   The patient tolerated the procedure well with no complications.  Blood  loss was 50 mL.      Jene Every, M.D.  Electronically Signed     JB/MEDQ  D:  10/08/2007  T:  10/09/2007  Job:  [id number]

## 2011-03-05 NOTE — Assessment & Plan Note (Signed)
[facility]                         GASTROENTEROLOGY OFFICE NOTE   NAME:Omar Khan, Omar Khan                      MRN:          [id number]  DATE:04/17/2007                            DOB:          03-18-1925    OFFICE CONSULTATION NOTE:   REASON FOR CONSULTATION:  Hemoccult positive stool.   HISTORY:  This is a pleasant 78 year old white male with a history of  coronary artery disease, hyperlipidemia, and anxiety who is referred  through the courtesy of Dr. Clelia Croft regarding hemoccult positive stool on  home testing.  Patient denies any active GI symptoms such as heartburn,  dysphagia, abdominal pain, change in bowel habits, melena, or  hematochezia.  He rarely has problems with hemorrhoids when constipated.  He does report having undergone a colonoscopy approximately four years  ago in [place], [place].  As best the patient can recall, no  abnormalities were found.  We have attempted to retrieve this report but  have been unsuccessful.   PAST MEDICAL HISTORY:  As above.   PAST SURGICAL HISTORY:  1. Tonsillectomy.  2. Hemorrhoidectomy.  3. Right hip replacement.   ALLERGIES:  Unspecified antihypertensives.   CURRENT MEDICATIONS:  1. Aspirin 325 mg daily.  2. Fish oil.  3. Chromium.  4. Omeprazole 20 mg b.i.d.  5. Sleep aid.  6. Metamucil.  7. B12.  8. Pyroxetine 20 mg daily.  9. He also uses alprazolam 0.5 mg p.r.n.   FAMILY HISTORY:  No family history of gastrointestinal malignancy.   SOCIAL HISTORY:  Patient is married with two children.  He lives with  his wife.  He is a retired Scientist, water quality for [employer].  He does not smoke.  He uses alcohol moderately.   REVIEW OF SYSTEMS:  Per diagnostic evaluation form.   PHYSICAL EXAMINATION:  GENERAL:  A well-appearing male in no acute  distress.  VITAL SIGNS:  Blood pressure is 118/50, heart rate 66, weight is 198.8  pounds.  He is 5 feet 6 inches in height.  HEENT:   Sclerae are anicteric.  Conjunctivae are pink.  Oral mucosa  intact.  The right side of his mouth droops.  LUNGS:  Clear.  HEART:  Regular.  ABDOMEN:  Soft without tenderness, mass, or hernia.  EXTREMITIES:  Without edema.   LABORATORIES:  CBC obtained on Mar 12, 2007 reveals a hemoglobin of  12.6.  MCV 92.7.   IMPRESSION:  This is an 77 year old gentleman who is referred for  asymptomatic hemoccult positive stool.  Alleged normal colonoscopy about  four years ago.  Normal hemoglobin.  I discussed in detail today with  patient that hemoccult positive stool is an abnormal finding; however,  it does not disclose the etiology or the clinical significance.  We  discussed the pro's and con's of further evaluation.  Specifically,  repeat colonoscopy.  I told him that the likelyhood of significant  pathology, given the negative exam four years ago, was low, but could  not be totally excluded.  After a full discussion, as well as reviewing  the nature of the procedure, its risks, benefits and alternatives, it  was his clear preference to proceed with colonoscopy to exclude  significant abnormality to explain the hemoccult positive stool.  We  will set this up in the near future at the patient's convenience.     Wilhemina Bonito. Marina Goodell, MD  Electronically Signed    JNP/MedQ  DD: 04/17/2007  DT: 04/18/2007  Job #: [id number]   cc:   Kari Baars, M.D.

## 2011-04-29 ENCOUNTER — Other Ambulatory Visit: Payer: Self-pay | Admitting: Dermatology

## 2011-07-22 LAB — CBC
HCT: 28.3 — ABNORMAL LOW
Hemoglobin: 9.6 — ABNORMAL LOW
MCHC: 34.1
RDW: 14.5

## 2011-07-22 LAB — BASIC METABOLIC PANEL
BUN: 18
CO2: 25
Glucose, Bld: 104 — ABNORMAL HIGH
Potassium: 4
Sodium: 139

## 2011-07-24 LAB — CROSSMATCH
ABO/RH(D): O NEG
Antibody Screen: NEGATIVE

## 2011-07-24 LAB — BASIC METABOLIC PANEL
BUN: 18
BUN: 20
BUN: 33 — ABNORMAL HIGH
CO2: 25
CO2: 25
CO2: 26
Calcium: 7.9 — ABNORMAL LOW
Calcium: 7.9 — ABNORMAL LOW
Calcium: 8.2 — ABNORMAL LOW
Calcium: 8.4
Chloride: 107
Creatinine, Ser: 1.21
Creatinine, Ser: 1.29
Creatinine, Ser: 1.73 — ABNORMAL HIGH
Creatinine, Ser: 3.14 — ABNORMAL HIGH
GFR calc Af Amer: 23 — ABNORMAL LOW
GFR calc Af Amer: 46 — ABNORMAL LOW
GFR calc non Af Amer: 19 — ABNORMAL LOW
GFR calc non Af Amer: 20 — ABNORMAL LOW
GFR calc non Af Amer: 53 — ABNORMAL LOW
GFR calc non Af Amer: 58 — ABNORMAL LOW
Glucose, Bld: 112 — ABNORMAL HIGH
Glucose, Bld: 118 — ABNORMAL HIGH
Glucose, Bld: 135 — ABNORMAL HIGH
Glucose, Bld: 148 — ABNORMAL HIGH
Potassium: 4
Potassium: 4.1
Potassium: 4.3
Potassium: 4.6
Sodium: 138

## 2011-07-24 LAB — URINALYSIS, ROUTINE W REFLEX MICROSCOPIC
Nitrite: NEGATIVE
Protein, ur: 100 — AB
Specific Gravity, Urine: 1.02
Urobilinogen, UA: 0.2
Urobilinogen, UA: 1
pH: 5.5

## 2011-07-24 LAB — COMPREHENSIVE METABOLIC PANEL
AST: 24
Albumin: 4.1
BUN: 30 — ABNORMAL HIGH
Chloride: 106
Creatinine, Ser: 1.75 — ABNORMAL HIGH
GFR calc Af Amer: 45 — ABNORMAL LOW
Potassium: 5
Total Bilirubin: 0.9
Total Protein: 6.7

## 2011-07-24 LAB — CBC
HCT: 29.7 — ABNORMAL LOW
HCT: 30.7 — ABNORMAL LOW
HCT: 31.5 — ABNORMAL LOW
MCHC: 34
MCHC: 34.5
MCV: 98.8
Platelets: 105 — ABNORMAL LOW
Platelets: 110 — ABNORMAL LOW
Platelets: 114 — ABNORMAL LOW
Platelets: 81 — ABNORMAL LOW
Platelets: 91 — ABNORMAL LOW
RBC: 3.27 — ABNORMAL LOW
RDW: 13
RDW: 14.3
RDW: 14.4
RDW: 15.1
RDW: 15.2
WBC: 4
WBC: 4.4

## 2011-07-24 LAB — PROTIME-INR
INR: 1
INR: 1.4
INR: 2.2 — ABNORMAL HIGH
Prothrombin Time: 14.6
Prothrombin Time: 18.2 — ABNORMAL HIGH
Prothrombin Time: 25.7 — ABNORMAL HIGH
Prothrombin Time: 29.2 — ABNORMAL HIGH

## 2011-07-24 LAB — URINE MICROSCOPIC-ADD ON

## 2011-07-24 LAB — CARDIAC PANEL(CRET KIN+CKTOT+MB+TROPI)
CK, MB: 6.5 — ABNORMAL HIGH
CK, MB: 9 — ABNORMAL HIGH
Relative Index: 0.9
Total CK: 938 — ABNORMAL HIGH
Troponin I: 0.06

## 2011-07-24 LAB — APTT: aPTT: 34

## 2011-07-26 LAB — DIFFERENTIAL
Basophils Absolute: 0
Eosinophils Relative: 2
Lymphocytes Relative: 31
Lymphs Abs: 1.5
Neutro Abs: 2.8
Neutrophils Relative %: 59

## 2011-07-26 LAB — HEMOGLOBIN AND HEMATOCRIT, BLOOD
HCT: 26.5 — ABNORMAL LOW
Hemoglobin: 9.3 — ABNORMAL LOW

## 2011-07-26 LAB — URINALYSIS, ROUTINE W REFLEX MICROSCOPIC
Bilirubin Urine: NEGATIVE
Glucose, UA: NEGATIVE
Hgb urine dipstick: NEGATIVE
Protein, ur: NEGATIVE
Specific Gravity, Urine: 1.022
Urobilinogen, UA: 0.2

## 2011-07-26 LAB — COMPREHENSIVE METABOLIC PANEL
AST: 23
BUN: 30 — ABNORMAL HIGH
CO2: 29
Calcium: 9.6
Creatinine, Ser: 1.96 — ABNORMAL HIGH
GFR calc Af Amer: 40 — ABNORMAL LOW
GFR calc non Af Amer: 33 — ABNORMAL LOW
Total Bilirubin: 0.6

## 2011-07-26 LAB — CBC
HCT: 26.7 — ABNORMAL LOW
Hemoglobin: 10.4 — ABNORMAL LOW
Hemoglobin: 8.1 — ABNORMAL LOW
MCHC: 34.8
MCHC: 34.9
MCHC: 35.1
MCV: 96.9
Platelets: 144 — ABNORMAL LOW
RBC: 2.76 — ABNORMAL LOW
RBC: 3.12 — ABNORMAL LOW
RDW: 17.6 — ABNORMAL HIGH
RDW: 18 — ABNORMAL HIGH

## 2011-07-26 LAB — TYPE AND SCREEN
ABO/RH(D): O NEG
Antibody Screen: NEGATIVE

## 2011-07-26 LAB — BASIC METABOLIC PANEL
CO2: 29
Calcium: 9.1
Creatinine, Ser: 1.04
GFR calc Af Amer: >60
GFR calc non Af Amer: >60
Glucose, Bld: 106 — ABNORMAL HIGH
Sodium: 140

## 2011-10-29 DIAGNOSIS — J159 Unspecified bacterial pneumonia: Secondary | ICD-10-CM | POA: Diagnosis not present

## 2011-10-29 DIAGNOSIS — J209 Acute bronchitis, unspecified: Secondary | ICD-10-CM | POA: Diagnosis not present

## 2011-11-12 DIAGNOSIS — R7301 Impaired fasting glucose: Secondary | ICD-10-CM | POA: Diagnosis not present

## 2011-11-12 DIAGNOSIS — E785 Hyperlipidemia, unspecified: Secondary | ICD-10-CM | POA: Diagnosis not present

## 2011-11-12 DIAGNOSIS — I2510 Atherosclerotic heart disease of native coronary artery without angina pectoris: Secondary | ICD-10-CM | POA: Diagnosis not present

## 2011-11-12 DIAGNOSIS — I10 Essential (primary) hypertension: Secondary | ICD-10-CM | POA: Diagnosis not present

## 2012-01-15 DIAGNOSIS — N400 Enlarged prostate without lower urinary tract symptoms: Secondary | ICD-10-CM | POA: Diagnosis not present

## 2012-01-15 DIAGNOSIS — C679 Malignant neoplasm of bladder, unspecified: Secondary | ICD-10-CM | POA: Diagnosis not present

## 2012-02-18 DIAGNOSIS — H40009 Preglaucoma, unspecified, unspecified eye: Secondary | ICD-10-CM | POA: Diagnosis not present

## 2012-04-02 DIAGNOSIS — H903 Sensorineural hearing loss, bilateral: Secondary | ICD-10-CM | POA: Diagnosis not present

## 2012-04-02 DIAGNOSIS — H6120 Impacted cerumen, unspecified ear: Secondary | ICD-10-CM | POA: Diagnosis not present

## 2012-05-05 DIAGNOSIS — I10 Essential (primary) hypertension: Secondary | ICD-10-CM | POA: Diagnosis not present

## 2012-05-05 DIAGNOSIS — E785 Hyperlipidemia, unspecified: Secondary | ICD-10-CM | POA: Diagnosis not present

## 2012-05-05 DIAGNOSIS — R7301 Impaired fasting glucose: Secondary | ICD-10-CM | POA: Diagnosis not present

## 2012-05-05 DIAGNOSIS — Z125 Encounter for screening for malignant neoplasm of prostate: Secondary | ICD-10-CM | POA: Diagnosis not present

## 2012-05-05 DIAGNOSIS — R82998 Other abnormal findings in urine: Secondary | ICD-10-CM | POA: Diagnosis not present

## 2012-05-12 DIAGNOSIS — D510 Vitamin B12 deficiency anemia due to intrinsic factor deficiency: Secondary | ICD-10-CM | POA: Diagnosis not present

## 2012-05-12 DIAGNOSIS — C679 Malignant neoplasm of bladder, unspecified: Secondary | ICD-10-CM | POA: Diagnosis not present

## 2012-05-12 DIAGNOSIS — I2510 Atherosclerotic heart disease of native coronary artery without angina pectoris: Secondary | ICD-10-CM | POA: Diagnosis not present

## 2012-05-12 DIAGNOSIS — Z0000 Encounter for general adult medical examination without abnormal findings: Secondary | ICD-10-CM | POA: Diagnosis not present

## 2012-05-12 DIAGNOSIS — D61818 Other pancytopenia: Secondary | ICD-10-CM | POA: Diagnosis not present

## 2012-05-12 DIAGNOSIS — I10 Essential (primary) hypertension: Secondary | ICD-10-CM | POA: Diagnosis not present

## 2012-05-12 DIAGNOSIS — E8881 Metabolic syndrome: Secondary | ICD-10-CM | POA: Diagnosis not present

## 2012-05-18 DIAGNOSIS — Z1212 Encounter for screening for malignant neoplasm of rectum: Secondary | ICD-10-CM | POA: Diagnosis not present

## 2012-06-17 DIAGNOSIS — L821 Other seborrheic keratosis: Secondary | ICD-10-CM | POA: Diagnosis not present

## 2012-06-17 DIAGNOSIS — D239 Other benign neoplasm of skin, unspecified: Secondary | ICD-10-CM | POA: Diagnosis not present

## 2012-06-17 DIAGNOSIS — L570 Actinic keratosis: Secondary | ICD-10-CM | POA: Diagnosis not present

## 2012-07-15 DIAGNOSIS — Z23 Encounter for immunization: Secondary | ICD-10-CM | POA: Diagnosis not present

## 2012-07-15 DIAGNOSIS — C679 Malignant neoplasm of bladder, unspecified: Secondary | ICD-10-CM | POA: Diagnosis not present

## 2012-07-15 DIAGNOSIS — N400 Enlarged prostate without lower urinary tract symptoms: Secondary | ICD-10-CM | POA: Diagnosis not present

## 2012-08-18 DIAGNOSIS — H4010X0 Unspecified open-angle glaucoma, stage unspecified: Secondary | ICD-10-CM | POA: Diagnosis not present

## 2012-10-20 DIAGNOSIS — H2510 Age-related nuclear cataract, unspecified eye: Secondary | ICD-10-CM | POA: Diagnosis not present

## 2012-10-22 DIAGNOSIS — H2510 Age-related nuclear cataract, unspecified eye: Secondary | ICD-10-CM | POA: Diagnosis not present

## 2012-10-26 ENCOUNTER — Ambulatory Visit: Payer: Self-pay | Admitting: Ophthalmology

## 2012-10-26 DIAGNOSIS — Z96649 Presence of unspecified artificial hip joint: Secondary | ICD-10-CM | POA: Diagnosis not present

## 2012-10-26 DIAGNOSIS — E7800 Pure hypercholesterolemia, unspecified: Secondary | ICD-10-CM | POA: Diagnosis not present

## 2012-10-26 DIAGNOSIS — I252 Old myocardial infarction: Secondary | ICD-10-CM | POA: Diagnosis not present

## 2012-10-26 DIAGNOSIS — H269 Unspecified cataract: Secondary | ICD-10-CM | POA: Diagnosis not present

## 2012-10-26 DIAGNOSIS — Z8551 Personal history of malignant neoplasm of bladder: Secondary | ICD-10-CM | POA: Diagnosis not present

## 2012-10-26 DIAGNOSIS — J342 Deviated nasal septum: Secondary | ICD-10-CM | POA: Diagnosis not present

## 2012-10-26 DIAGNOSIS — Z79899 Other long term (current) drug therapy: Secondary | ICD-10-CM | POA: Diagnosis not present

## 2012-10-26 DIAGNOSIS — Z87891 Personal history of nicotine dependence: Secondary | ICD-10-CM | POA: Diagnosis not present

## 2012-10-26 DIAGNOSIS — Z87898 Personal history of other specified conditions: Secondary | ICD-10-CM | POA: Diagnosis not present

## 2012-10-26 DIAGNOSIS — I10 Essential (primary) hypertension: Secondary | ICD-10-CM | POA: Diagnosis not present

## 2012-10-26 DIAGNOSIS — H2510 Age-related nuclear cataract, unspecified eye: Secondary | ICD-10-CM | POA: Diagnosis not present

## 2012-11-12 DIAGNOSIS — I10 Essential (primary) hypertension: Secondary | ICD-10-CM | POA: Diagnosis not present

## 2012-11-12 DIAGNOSIS — E781 Pure hyperglyceridemia: Secondary | ICD-10-CM | POA: Diagnosis not present

## 2012-11-12 DIAGNOSIS — D61818 Other pancytopenia: Secondary | ICD-10-CM | POA: Diagnosis not present

## 2012-11-12 DIAGNOSIS — R7301 Impaired fasting glucose: Secondary | ICD-10-CM | POA: Diagnosis not present

## 2012-11-12 DIAGNOSIS — Z1331 Encounter for screening for depression: Secondary | ICD-10-CM | POA: Diagnosis not present

## 2012-11-12 DIAGNOSIS — I2510 Atherosclerotic heart disease of native coronary artery without angina pectoris: Secondary | ICD-10-CM | POA: Diagnosis not present

## 2012-11-12 DIAGNOSIS — E785 Hyperlipidemia, unspecified: Secondary | ICD-10-CM | POA: Diagnosis not present

## 2012-11-23 DIAGNOSIS — Z1212 Encounter for screening for malignant neoplasm of rectum: Secondary | ICD-10-CM | POA: Diagnosis not present

## 2012-11-25 ENCOUNTER — Telehealth: Payer: Self-pay | Admitting: Hematology & Oncology

## 2012-11-25 NOTE — Telephone Encounter (Signed)
Pt aware of 2-27 appointment °

## 2012-12-17 ENCOUNTER — Ambulatory Visit: Payer: Medicare Other

## 2012-12-17 ENCOUNTER — Other Ambulatory Visit (HOSPITAL_BASED_OUTPATIENT_CLINIC_OR_DEPARTMENT_OTHER): Payer: Medicare Other | Admitting: Lab

## 2012-12-17 ENCOUNTER — Ambulatory Visit (HOSPITAL_BASED_OUTPATIENT_CLINIC_OR_DEPARTMENT_OTHER): Payer: Medicare Other | Admitting: Hematology & Oncology

## 2012-12-17 VITALS — BP 131/55 | HR 66 | Temp 97.4°F | Resp 18 | Ht 65.0 in | Wt 192.0 lb

## 2012-12-17 DIAGNOSIS — D72819 Decreased white blood cell count, unspecified: Secondary | ICD-10-CM

## 2012-12-17 DIAGNOSIS — D696 Thrombocytopenia, unspecified: Secondary | ICD-10-CM

## 2012-12-17 DIAGNOSIS — D539 Nutritional anemia, unspecified: Secondary | ICD-10-CM | POA: Diagnosis not present

## 2012-12-17 LAB — CBC WITH DIFFERENTIAL (CANCER CENTER ONLY)
BASO#: 0 10*3/uL (ref 0.0–0.2)
Eosinophils Absolute: 0.2 10*3/uL (ref 0.0–0.5)
HGB: 12 g/dL — ABNORMAL LOW (ref 13.0–17.1)
MONO#: 0.3 10*3/uL (ref 0.1–0.9)
NEUT#: 2.5 10*3/uL (ref 1.5–6.5)
Platelets: 96 10*3/uL — ABNORMAL LOW (ref 145–400)
RBC: 3.6 10*6/uL — ABNORMAL LOW (ref 4.20–5.70)
WBC: 4.5 10*3/uL (ref 4.0–10.0)

## 2012-12-17 LAB — CHCC SATELLITE - SMEAR

## 2012-12-17 NOTE — Progress Notes (Signed)
This office note has been dictated.
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
## 2012-12-21 LAB — RETICULOCYTES (CHCC)
ABS Retic: 74 10*3/uL (ref 19.0–186.0)
RBC.: 3.7 MIL/uL — ABNORMAL LOW (ref 4.22–5.81)
Retic Ct Pct: 2 % (ref 0.4–2.3)

## 2012-12-21 LAB — FOLATE RBC: RBC Folate: 854 ng/mL (ref >=366)

## 2012-12-21 LAB — LACTATE DEHYDROGENASE: LDH: 133 U/L (ref 94–250)

## 2012-12-30 ENCOUNTER — Telehealth: Payer: Self-pay | Admitting: Hematology & Oncology

## 2012-12-30 NOTE — Telephone Encounter (Signed)
Per Dr. Myna Hidalgo pt is scheduled for 01-11-13 at [facility] with Dr. Sherrlyn Hock. Tia scheduled it. Pt's number is always busy. I left message on Mildreds voice mail to have pt call.

## 2012-12-31 ENCOUNTER — Telehealth: Payer: Self-pay | Admitting: Hematology & Oncology

## 2012-12-31 NOTE — Telephone Encounter (Signed)
Pt's phone still busy. Left another message with emergency contact # for pt or her to call for 3-24 appointment.

## 2012-12-31 NOTE — Telephone Encounter (Signed)
Mailed letter with information for 3-24 appointment at [facility]

## 2013-01-11 ENCOUNTER — Ambulatory Visit: Payer: Self-pay | Admitting: Internal Medicine

## 2013-01-11 DIAGNOSIS — D7589 Other specified diseases of blood and blood-forming organs: Secondary | ICD-10-CM | POA: Diagnosis not present

## 2013-01-11 DIAGNOSIS — D72819 Decreased white blood cell count, unspecified: Secondary | ICD-10-CM | POA: Diagnosis not present

## 2013-01-11 DIAGNOSIS — F411 Generalized anxiety disorder: Secondary | ICD-10-CM | POA: Diagnosis not present

## 2013-01-11 DIAGNOSIS — Z7982 Long term (current) use of aspirin: Secondary | ICD-10-CM | POA: Diagnosis not present

## 2013-01-11 DIAGNOSIS — N189 Chronic kidney disease, unspecified: Secondary | ICD-10-CM | POA: Diagnosis not present

## 2013-01-11 DIAGNOSIS — M4800 Spinal stenosis, site unspecified: Secondary | ICD-10-CM | POA: Diagnosis not present

## 2013-01-11 DIAGNOSIS — D638 Anemia in other chronic diseases classified elsewhere: Secondary | ICD-10-CM | POA: Diagnosis not present

## 2013-01-11 DIAGNOSIS — I129 Hypertensive chronic kidney disease with stage 1 through stage 4 chronic kidney disease, or unspecified chronic kidney disease: Secondary | ICD-10-CM | POA: Diagnosis not present

## 2013-01-11 DIAGNOSIS — E785 Hyperlipidemia, unspecified: Secondary | ICD-10-CM | POA: Diagnosis not present

## 2013-01-11 DIAGNOSIS — F329 Major depressive disorder, single episode, unspecified: Secondary | ICD-10-CM | POA: Diagnosis not present

## 2013-01-11 DIAGNOSIS — Z79899 Other long term (current) drug therapy: Secondary | ICD-10-CM | POA: Diagnosis not present

## 2013-01-11 DIAGNOSIS — I2510 Atherosclerotic heart disease of native coronary artery without angina pectoris: Secondary | ICD-10-CM | POA: Diagnosis not present

## 2013-01-11 DIAGNOSIS — G510 Bell's palsy: Secondary | ICD-10-CM | POA: Diagnosis not present

## 2013-01-11 DIAGNOSIS — D696 Thrombocytopenia, unspecified: Secondary | ICD-10-CM | POA: Diagnosis not present

## 2013-01-11 DIAGNOSIS — D709 Neutropenia, unspecified: Secondary | ICD-10-CM | POA: Diagnosis not present

## 2013-01-11 DIAGNOSIS — K219 Gastro-esophageal reflux disease without esophagitis: Secondary | ICD-10-CM | POA: Diagnosis not present

## 2013-01-13 DIAGNOSIS — I129 Hypertensive chronic kidney disease with stage 1 through stage 4 chronic kidney disease, or unspecified chronic kidney disease: Secondary | ICD-10-CM | POA: Diagnosis not present

## 2013-01-13 DIAGNOSIS — D709 Neutropenia, unspecified: Secondary | ICD-10-CM | POA: Diagnosis not present

## 2013-01-13 DIAGNOSIS — D696 Thrombocytopenia, unspecified: Secondary | ICD-10-CM | POA: Diagnosis not present

## 2013-01-13 DIAGNOSIS — D7589 Other specified diseases of blood and blood-forming organs: Secondary | ICD-10-CM | POA: Diagnosis not present

## 2013-01-13 DIAGNOSIS — D72819 Decreased white blood cell count, unspecified: Secondary | ICD-10-CM | POA: Diagnosis not present

## 2013-01-13 DIAGNOSIS — I2510 Atherosclerotic heart disease of native coronary artery without angina pectoris: Secondary | ICD-10-CM | POA: Diagnosis not present

## 2013-01-19 ENCOUNTER — Ambulatory Visit: Payer: Self-pay | Admitting: Internal Medicine

## 2013-01-19 DIAGNOSIS — G510 Bell's palsy: Secondary | ICD-10-CM | POA: Diagnosis not present

## 2013-01-19 DIAGNOSIS — E785 Hyperlipidemia, unspecified: Secondary | ICD-10-CM | POA: Diagnosis not present

## 2013-01-19 DIAGNOSIS — Z79899 Other long term (current) drug therapy: Secondary | ICD-10-CM | POA: Diagnosis not present

## 2013-01-19 DIAGNOSIS — K219 Gastro-esophageal reflux disease without esophagitis: Secondary | ICD-10-CM | POA: Diagnosis not present

## 2013-01-19 DIAGNOSIS — D72819 Decreased white blood cell count, unspecified: Secondary | ICD-10-CM | POA: Diagnosis not present

## 2013-01-19 DIAGNOSIS — M4800 Spinal stenosis, site unspecified: Secondary | ICD-10-CM | POA: Diagnosis not present

## 2013-01-19 DIAGNOSIS — I10 Essential (primary) hypertension: Secondary | ICD-10-CM | POA: Diagnosis not present

## 2013-01-19 DIAGNOSIS — D709 Neutropenia, unspecified: Secondary | ICD-10-CM | POA: Diagnosis not present

## 2013-01-19 DIAGNOSIS — I252 Old myocardial infarction: Secondary | ICD-10-CM | POA: Diagnosis not present

## 2013-01-19 DIAGNOSIS — D7589 Other specified diseases of blood and blood-forming organs: Secondary | ICD-10-CM | POA: Diagnosis not present

## 2013-01-19 DIAGNOSIS — D696 Thrombocytopenia, unspecified: Secondary | ICD-10-CM | POA: Diagnosis not present

## 2013-01-19 DIAGNOSIS — R29810 Facial weakness: Secondary | ICD-10-CM | POA: Diagnosis not present

## 2013-01-19 DIAGNOSIS — I2510 Atherosclerotic heart disease of native coronary artery without angina pectoris: Secondary | ICD-10-CM | POA: Diagnosis not present

## 2013-01-19 DIAGNOSIS — F329 Major depressive disorder, single episode, unspecified: Secondary | ICD-10-CM | POA: Diagnosis not present

## 2013-01-20 DIAGNOSIS — D709 Neutropenia, unspecified: Secondary | ICD-10-CM | POA: Diagnosis not present

## 2013-01-20 DIAGNOSIS — D696 Thrombocytopenia, unspecified: Secondary | ICD-10-CM | POA: Diagnosis not present

## 2013-01-20 DIAGNOSIS — D72819 Decreased white blood cell count, unspecified: Secondary | ICD-10-CM | POA: Diagnosis not present

## 2013-01-20 DIAGNOSIS — D7589 Other specified diseases of blood and blood-forming organs: Secondary | ICD-10-CM | POA: Diagnosis not present

## 2013-01-20 LAB — CBC CANCER CENTER
Basophil #: 0 x10 3/mm (ref 0.0–0.1)
Basophil %: 1 %
Eosinophil #: 0.2 x10 3/mm (ref 0.0–0.7)
Lymphocyte #: 1.7 x10 3/mm (ref 1.0–3.6)
MCHC: 33.5 g/dL (ref 32.0–36.0)
Monocyte #: 0.3 x10 3/mm (ref 0.2–1.0)
Monocyte %: 6 %
Neutrophil #: 2.3 x10 3/mm (ref 1.4–6.5)
Neutrophil %: 51.9 %
Platelet: 102 x10 3/mm — ABNORMAL LOW (ref 150–440)
RBC: 3.75 10*6/uL — ABNORMAL LOW (ref 4.40–5.90)
WBC: 4.5 x10 3/mm (ref 3.8–10.6)

## 2013-01-20 LAB — RETICULOCYTES
Absolute Retic Count: 0.064 10*6/uL
Reticulocyte: 1.7 %

## 2013-01-21 DIAGNOSIS — N400 Enlarged prostate without lower urinary tract symptoms: Secondary | ICD-10-CM | POA: Diagnosis not present

## 2013-01-21 DIAGNOSIS — C679 Malignant neoplasm of bladder, unspecified: Secondary | ICD-10-CM | POA: Diagnosis not present

## 2013-01-28 ENCOUNTER — Ambulatory Visit: Payer: Self-pay | Admitting: Ophthalmology

## 2013-01-28 DIAGNOSIS — Z01812 Encounter for preprocedural laboratory examination: Secondary | ICD-10-CM | POA: Diagnosis not present

## 2013-01-28 DIAGNOSIS — H2510 Age-related nuclear cataract, unspecified eye: Secondary | ICD-10-CM | POA: Diagnosis not present

## 2013-01-28 LAB — POTASSIUM: Potassium: 4.2 mmol/L (ref 3.5–5.1)

## 2013-02-01 ENCOUNTER — Ambulatory Visit: Payer: Self-pay | Admitting: Ophthalmology

## 2013-02-01 DIAGNOSIS — H2510 Age-related nuclear cataract, unspecified eye: Secondary | ICD-10-CM | POA: Diagnosis not present

## 2013-02-01 DIAGNOSIS — Z01810 Encounter for preprocedural cardiovascular examination: Secondary | ICD-10-CM | POA: Diagnosis not present

## 2013-02-01 DIAGNOSIS — I10 Essential (primary) hypertension: Secondary | ICD-10-CM | POA: Diagnosis not present

## 2013-02-08 ENCOUNTER — Ambulatory Visit: Payer: Self-pay | Admitting: Ophthalmology

## 2013-02-08 DIAGNOSIS — J342 Deviated nasal septum: Secondary | ICD-10-CM | POA: Diagnosis not present

## 2013-02-08 DIAGNOSIS — H2510 Age-related nuclear cataract, unspecified eye: Secondary | ICD-10-CM | POA: Diagnosis not present

## 2013-02-08 DIAGNOSIS — Z8551 Personal history of malignant neoplasm of bladder: Secondary | ICD-10-CM | POA: Diagnosis not present

## 2013-02-08 DIAGNOSIS — I10 Essential (primary) hypertension: Secondary | ICD-10-CM | POA: Diagnosis not present

## 2013-02-08 DIAGNOSIS — I252 Old myocardial infarction: Secondary | ICD-10-CM | POA: Diagnosis not present

## 2013-02-08 DIAGNOSIS — H269 Unspecified cataract: Secondary | ICD-10-CM | POA: Diagnosis not present

## 2013-02-08 DIAGNOSIS — E7800 Pure hypercholesterolemia, unspecified: Secondary | ICD-10-CM | POA: Diagnosis not present

## 2013-02-08 DIAGNOSIS — K219 Gastro-esophageal reflux disease without esophagitis: Secondary | ICD-10-CM | POA: Diagnosis not present

## 2013-02-08 DIAGNOSIS — Z87891 Personal history of nicotine dependence: Secondary | ICD-10-CM | POA: Diagnosis not present

## 2013-02-08 DIAGNOSIS — Z79899 Other long term (current) drug therapy: Secondary | ICD-10-CM | POA: Diagnosis not present

## 2013-02-08 DIAGNOSIS — K449 Diaphragmatic hernia without obstruction or gangrene: Secondary | ICD-10-CM | POA: Diagnosis not present

## 2013-02-08 DIAGNOSIS — F411 Generalized anxiety disorder: Secondary | ICD-10-CM | POA: Diagnosis not present

## 2013-02-17 DIAGNOSIS — D1801 Hemangioma of skin and subcutaneous tissue: Secondary | ICD-10-CM | POA: Diagnosis not present

## 2013-02-17 DIAGNOSIS — L819 Disorder of pigmentation, unspecified: Secondary | ICD-10-CM | POA: Diagnosis not present

## 2013-02-17 DIAGNOSIS — L723 Sebaceous cyst: Secondary | ICD-10-CM | POA: Diagnosis not present

## 2013-02-17 DIAGNOSIS — L821 Other seborrheic keratosis: Secondary | ICD-10-CM | POA: Diagnosis not present

## 2013-02-17 DIAGNOSIS — L570 Actinic keratosis: Secondary | ICD-10-CM | POA: Diagnosis not present

## 2013-02-18 ENCOUNTER — Ambulatory Visit: Payer: Self-pay | Admitting: Internal Medicine

## 2013-03-04 DIAGNOSIS — H43819 Vitreous degeneration, unspecified eye: Secondary | ICD-10-CM | POA: Diagnosis not present

## 2013-04-01 DIAGNOSIS — M545 Low back pain: Secondary | ICD-10-CM | POA: Diagnosis not present

## 2013-04-06 DIAGNOSIS — M545 Low back pain: Secondary | ICD-10-CM | POA: Diagnosis not present

## 2013-04-08 DIAGNOSIS — M545 Low back pain: Secondary | ICD-10-CM | POA: Diagnosis not present

## 2013-04-09 IMAGING — US ABDOMEN ULTRASOUND LIMITED
1 series · 14 of 25 positions shown · non-contrast
Comparison: none

REASON FOR EXAM: Liver Spleen thrombocypenia eval hepatomegaly cirrhosis
COMMENTS:

PROCEDURE:     US  - US ABDOMEN LIMITED SURVEY  - January 13, 2013  [DATE]
RESULT:

[Series 1: abdomen ultrasound limited · 0.31mm/px · 70 acquisitions, 14 frames shown]
[im 1/70]
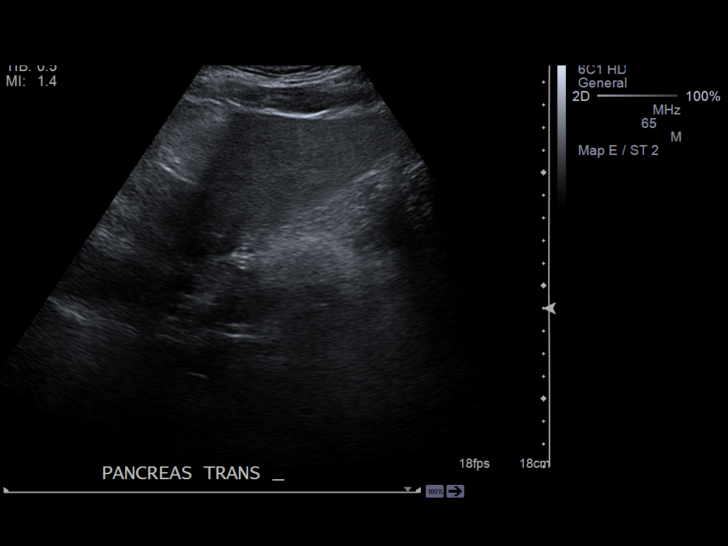
[im 6/70]
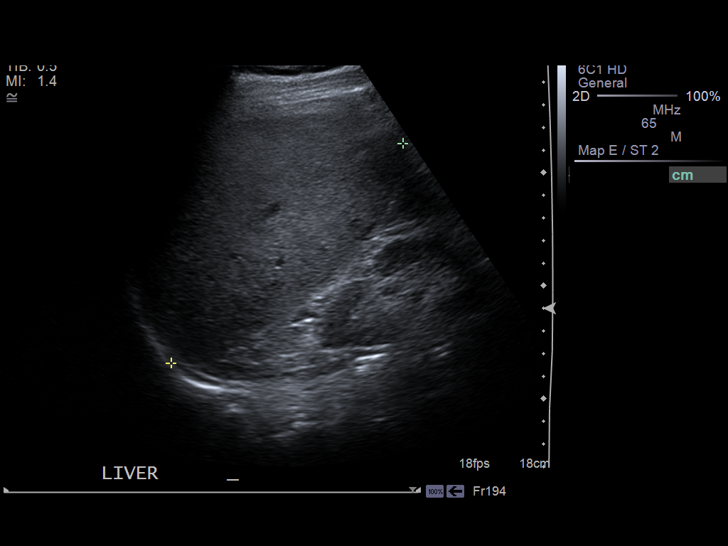
[im 12/70]
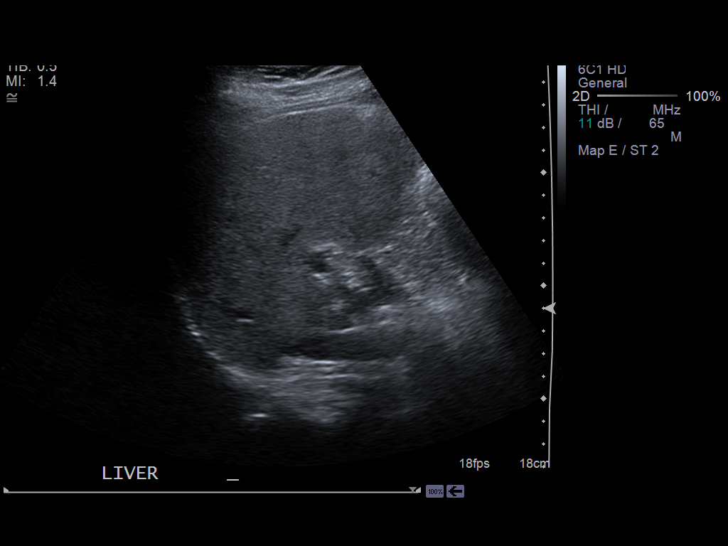
[im 18/70]
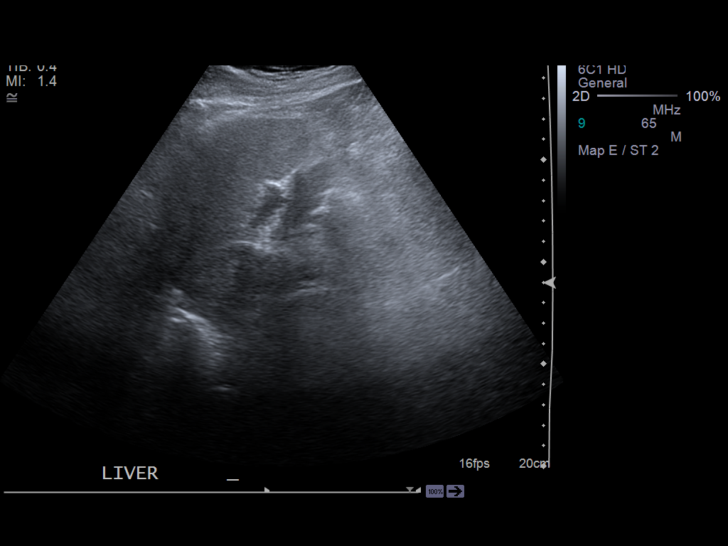
[im 24/70]
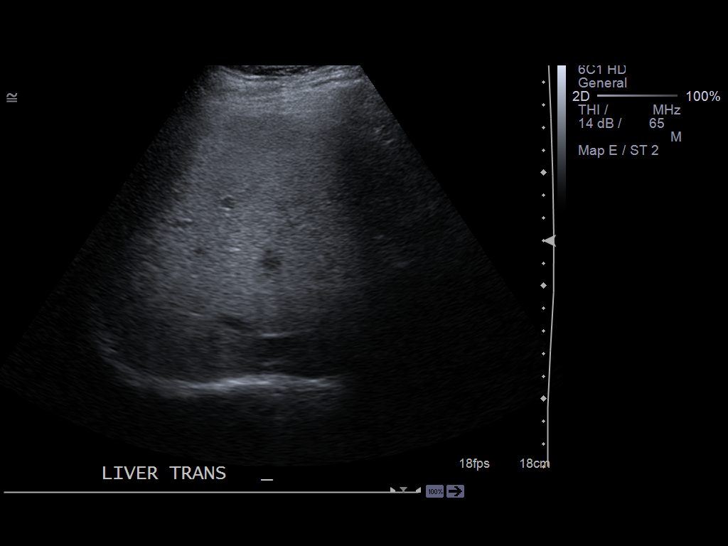
[im 26/70]
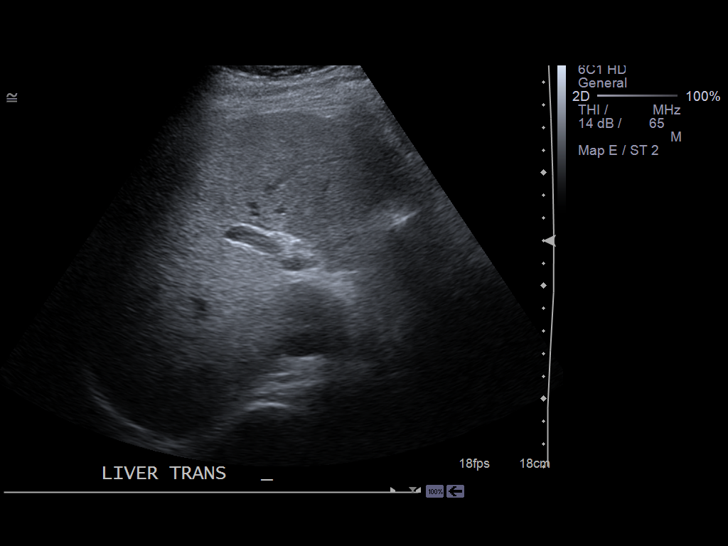
[im 32/70]
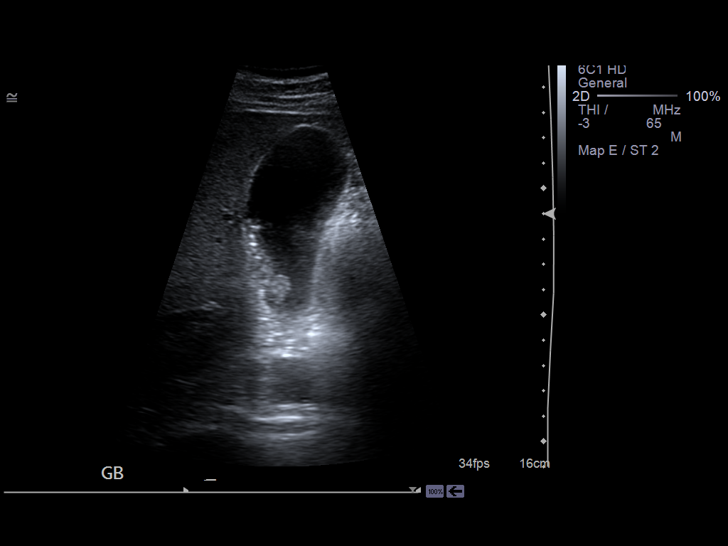
[im 38/70]
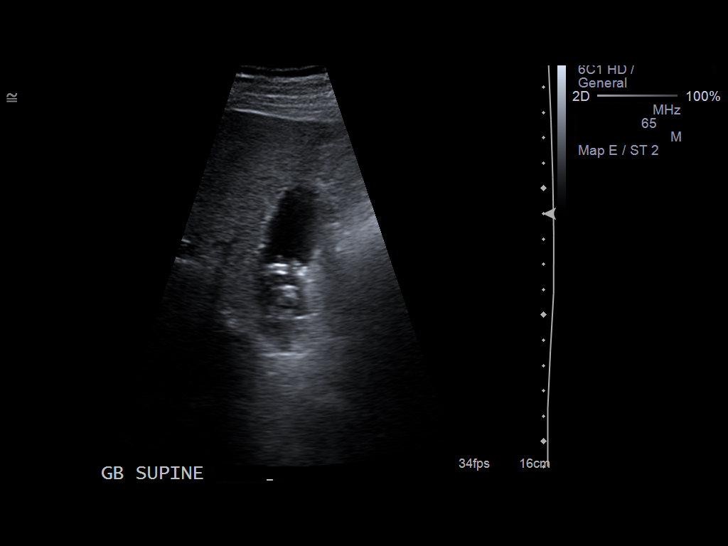
[im 44/70]
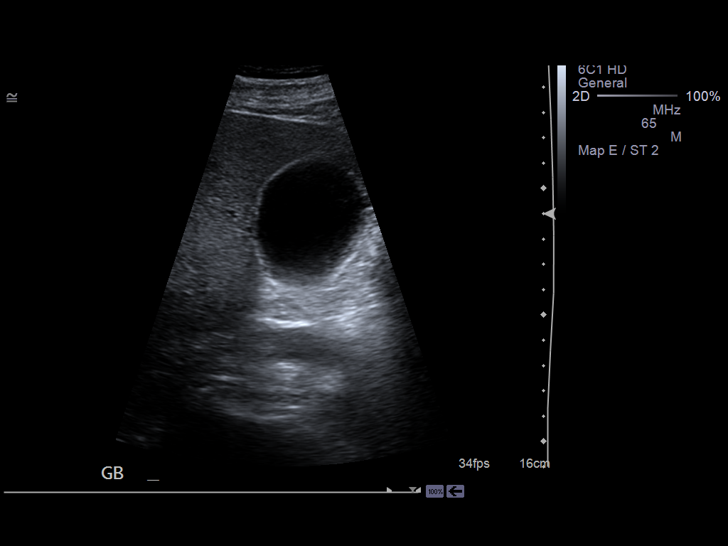
[im 47/70]
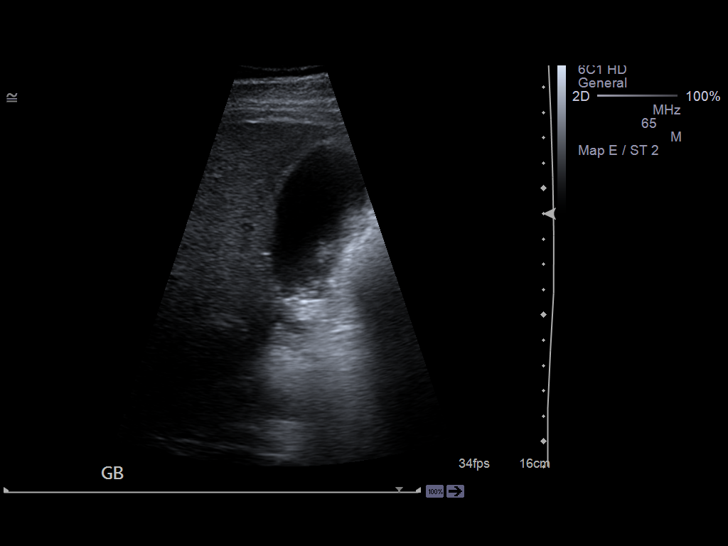
[im 52/70]
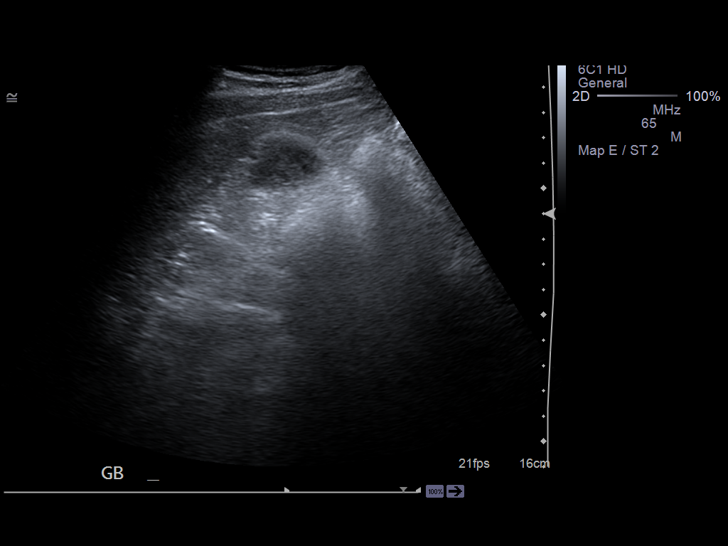
[im 58/70]
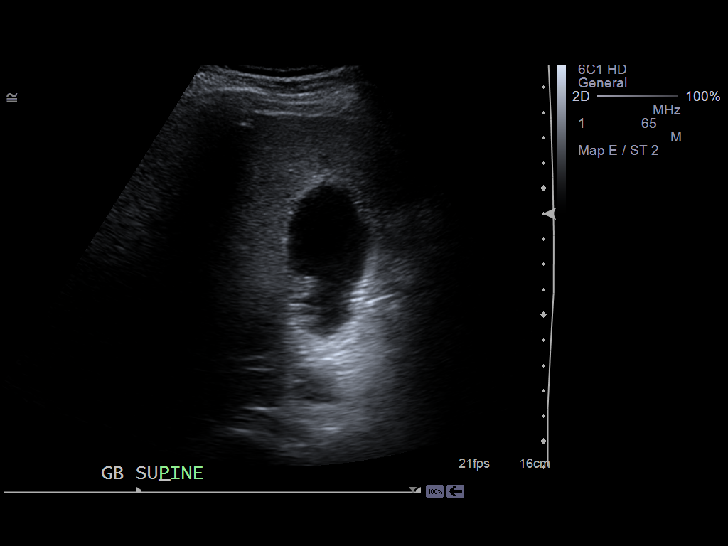
[im 64/70]
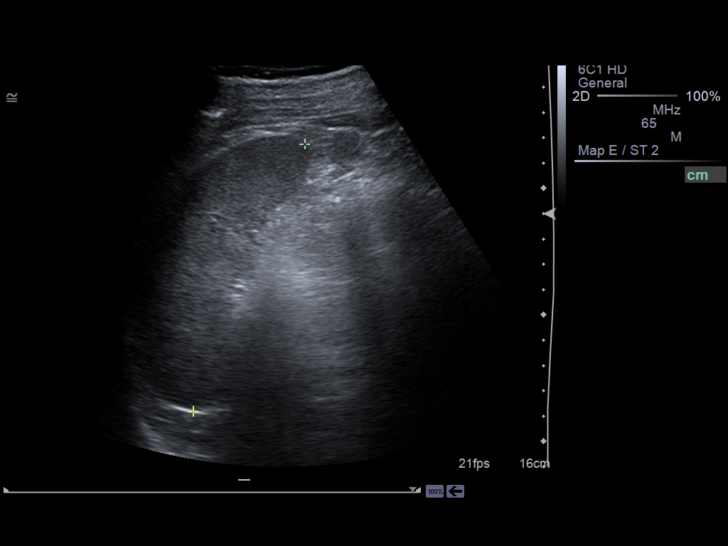
[im 70/70]
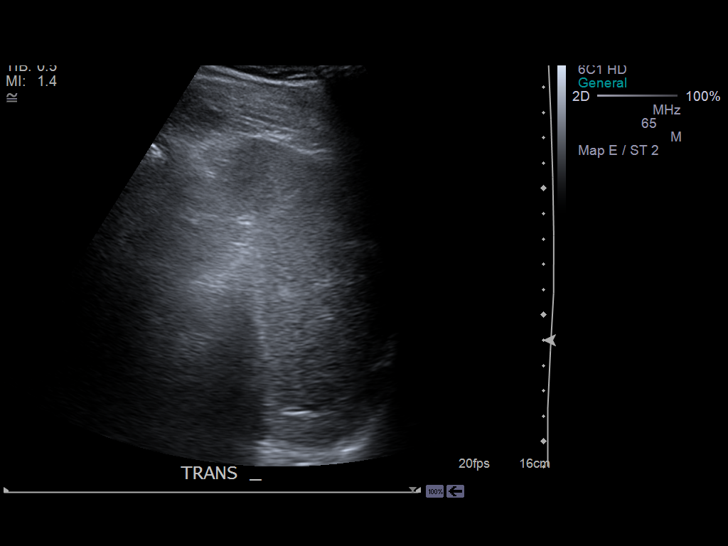

[14 of 25 positions shown; findings below may reference images not displayed]

FINDINGS: The liver demonstrates a homogeneous echotexture. It measures
14.13 cm along the midline which is within normal limits. The spleen
measures 9.45 cm. The pancreas is partially visualized secondary to bowel
gas.

Evaluation of the gallbladder demonstrates small, mobile gallstones and
sludge balls within the gallbladder. There is no evidence of gallbladder
wall thickening, sonographic Murphy's sign, or common bile duct dilatation.
The gallbladder wall measures 1.2 mm in thickness and the common bile duct
is 2.3 mm in diameter.
IMPRESSION: 1.     No evidence of hepatosplenomegaly.
2.     Sludge balls within the gallbladder without sonographic evidence of
cholecystitis.

## 2013-04-13 DIAGNOSIS — M545 Low back pain: Secondary | ICD-10-CM | POA: Diagnosis not present

## 2013-04-15 DIAGNOSIS — M545 Low back pain: Secondary | ICD-10-CM | POA: Diagnosis not present

## 2013-04-20 DIAGNOSIS — M545 Low back pain: Secondary | ICD-10-CM | POA: Diagnosis not present

## 2013-05-05 DIAGNOSIS — M545 Low back pain: Secondary | ICD-10-CM | POA: Diagnosis not present

## 2013-05-11 DIAGNOSIS — Z125 Encounter for screening for malignant neoplasm of prostate: Secondary | ICD-10-CM | POA: Diagnosis not present

## 2013-05-11 DIAGNOSIS — I10 Essential (primary) hypertension: Secondary | ICD-10-CM | POA: Diagnosis not present

## 2013-05-11 DIAGNOSIS — R7301 Impaired fasting glucose: Secondary | ICD-10-CM | POA: Diagnosis not present

## 2013-05-11 DIAGNOSIS — E785 Hyperlipidemia, unspecified: Secondary | ICD-10-CM | POA: Diagnosis not present

## 2013-05-17 DIAGNOSIS — I10 Essential (primary) hypertension: Secondary | ICD-10-CM | POA: Diagnosis not present

## 2013-05-17 DIAGNOSIS — I2510 Atherosclerotic heart disease of native coronary artery without angina pectoris: Secondary | ICD-10-CM | POA: Diagnosis not present

## 2013-05-17 DIAGNOSIS — E785 Hyperlipidemia, unspecified: Secondary | ICD-10-CM | POA: Diagnosis not present

## 2013-05-17 DIAGNOSIS — Z0000 Encounter for general adult medical examination without abnormal findings: Secondary | ICD-10-CM | POA: Diagnosis not present

## 2013-05-17 DIAGNOSIS — R7301 Impaired fasting glucose: Secondary | ICD-10-CM | POA: Diagnosis not present

## 2013-05-18 DIAGNOSIS — Z1212 Encounter for screening for malignant neoplasm of rectum: Secondary | ICD-10-CM | POA: Diagnosis not present

## 2013-06-09 DIAGNOSIS — I10 Essential (primary) hypertension: Secondary | ICD-10-CM | POA: Diagnosis not present

## 2013-06-09 DIAGNOSIS — I2510 Atherosclerotic heart disease of native coronary artery without angina pectoris: Secondary | ICD-10-CM | POA: Diagnosis not present

## 2013-06-09 DIAGNOSIS — I4891 Unspecified atrial fibrillation: Secondary | ICD-10-CM | POA: Diagnosis not present

## 2013-06-09 DIAGNOSIS — Z6825 Body mass index (BMI) 25.0-25.9, adult: Secondary | ICD-10-CM | POA: Diagnosis not present

## 2013-06-11 DIAGNOSIS — I2510 Atherosclerotic heart disease of native coronary artery without angina pectoris: Secondary | ICD-10-CM | POA: Diagnosis not present

## 2013-06-11 DIAGNOSIS — I4891 Unspecified atrial fibrillation: Secondary | ICD-10-CM | POA: Diagnosis not present

## 2013-06-11 DIAGNOSIS — I10 Essential (primary) hypertension: Secondary | ICD-10-CM | POA: Diagnosis not present

## 2013-06-11 DIAGNOSIS — Z6832 Body mass index (BMI) 32.0-32.9, adult: Secondary | ICD-10-CM | POA: Diagnosis not present

## 2013-06-22 DIAGNOSIS — I209 Angina pectoris, unspecified: Secondary | ICD-10-CM | POA: Diagnosis not present

## 2013-06-22 DIAGNOSIS — I6529 Occlusion and stenosis of unspecified carotid artery: Secondary | ICD-10-CM | POA: Diagnosis not present

## 2013-06-22 DIAGNOSIS — R011 Cardiac murmur, unspecified: Secondary | ICD-10-CM | POA: Diagnosis not present

## 2013-06-22 DIAGNOSIS — I4891 Unspecified atrial fibrillation: Secondary | ICD-10-CM | POA: Diagnosis not present

## 2013-06-25 DIAGNOSIS — Z23 Encounter for immunization: Secondary | ICD-10-CM | POA: Diagnosis not present

## 2013-06-25 DIAGNOSIS — Z6832 Body mass index (BMI) 32.0-32.9, adult: Secondary | ICD-10-CM | POA: Diagnosis not present

## 2013-06-25 DIAGNOSIS — I4891 Unspecified atrial fibrillation: Secondary | ICD-10-CM | POA: Diagnosis not present

## 2013-06-25 DIAGNOSIS — I10 Essential (primary) hypertension: Secondary | ICD-10-CM | POA: Diagnosis not present

## 2013-07-12 DIAGNOSIS — I252 Old myocardial infarction: Secondary | ICD-10-CM | POA: Diagnosis not present

## 2013-07-12 DIAGNOSIS — I209 Angina pectoris, unspecified: Secondary | ICD-10-CM | POA: Diagnosis not present

## 2013-07-12 DIAGNOSIS — I4891 Unspecified atrial fibrillation: Secondary | ICD-10-CM | POA: Diagnosis not present

## 2013-07-19 DIAGNOSIS — I10 Essential (primary) hypertension: Secondary | ICD-10-CM | POA: Diagnosis not present

## 2013-07-19 DIAGNOSIS — I4891 Unspecified atrial fibrillation: Secondary | ICD-10-CM | POA: Diagnosis not present

## 2013-07-28 ENCOUNTER — Ambulatory Visit: Payer: Self-pay | Admitting: Internal Medicine

## 2013-07-28 DIAGNOSIS — F329 Major depressive disorder, single episode, unspecified: Secondary | ICD-10-CM | POA: Diagnosis not present

## 2013-07-28 DIAGNOSIS — D696 Thrombocytopenia, unspecified: Secondary | ICD-10-CM | POA: Diagnosis not present

## 2013-07-28 DIAGNOSIS — K219 Gastro-esophageal reflux disease without esophagitis: Secondary | ICD-10-CM | POA: Diagnosis not present

## 2013-07-28 DIAGNOSIS — I252 Old myocardial infarction: Secondary | ICD-10-CM | POA: Diagnosis not present

## 2013-07-28 DIAGNOSIS — Z79899 Other long term (current) drug therapy: Secondary | ICD-10-CM | POA: Diagnosis not present

## 2013-07-28 DIAGNOSIS — E785 Hyperlipidemia, unspecified: Secondary | ICD-10-CM | POA: Diagnosis not present

## 2013-07-28 DIAGNOSIS — I10 Essential (primary) hypertension: Secondary | ICD-10-CM | POA: Diagnosis not present

## 2013-07-28 DIAGNOSIS — G510 Bell's palsy: Secondary | ICD-10-CM | POA: Diagnosis not present

## 2013-07-28 DIAGNOSIS — F411 Generalized anxiety disorder: Secondary | ICD-10-CM | POA: Diagnosis not present

## 2013-07-28 DIAGNOSIS — M4800 Spinal stenosis, site unspecified: Secondary | ICD-10-CM | POA: Diagnosis not present

## 2013-07-28 DIAGNOSIS — Z87891 Personal history of nicotine dependence: Secondary | ICD-10-CM | POA: Diagnosis not present

## 2013-07-28 DIAGNOSIS — D72819 Decreased white blood cell count, unspecified: Secondary | ICD-10-CM | POA: Diagnosis not present

## 2013-07-28 DIAGNOSIS — R29810 Facial weakness: Secondary | ICD-10-CM | POA: Diagnosis not present

## 2013-07-28 DIAGNOSIS — I2510 Atherosclerotic heart disease of native coronary artery without angina pectoris: Secondary | ICD-10-CM | POA: Diagnosis not present

## 2013-07-28 LAB — CBC CANCER CENTER
Basophil #: 0 x10 3/mm (ref 0.0–0.1)
Basophil %: 1.1 %
Eosinophil #: 0.3 x10 3/mm (ref 0.0–0.7)
Eosinophil %: 5.6 %
HCT: 37.5 % — ABNORMAL LOW (ref 40.0–52.0)
HGB: 12.4 g/dL — ABNORMAL LOW (ref 13.0–18.0)
Lymphocyte #: 1.1 x10 3/mm (ref 1.0–3.6)
Lymphocyte %: 24.9 %
MCH: 32.6 pg (ref 26.0–34.0)
MCHC: 33.2 g/dL (ref 32.0–36.0)
MCV: 98 fL (ref 80–100)
Monocyte #: 0.3 x10 3/mm (ref 0.2–1.0)
Monocyte %: 7.3 %
Neutrophil #: 2.8 x10 3/mm (ref 1.4–6.5)
Neutrophil %: 61.1 %
Platelet: 116 x10 3/mm — ABNORMAL LOW (ref 150–440)
RBC: 3.81 10*6/uL — ABNORMAL LOW (ref 4.40–5.90)
RDW: 15.2 % — ABNORMAL HIGH (ref 11.5–14.5)
WBC: 4.6 x10 3/mm (ref 3.8–10.6)

## 2013-08-13 DIAGNOSIS — H4010X0 Unspecified open-angle glaucoma, stage unspecified: Secondary | ICD-10-CM | POA: Diagnosis not present

## 2013-08-16 DIAGNOSIS — I209 Angina pectoris, unspecified: Secondary | ICD-10-CM | POA: Diagnosis not present

## 2013-08-16 DIAGNOSIS — I4891 Unspecified atrial fibrillation: Secondary | ICD-10-CM | POA: Diagnosis not present

## 2013-08-16 DIAGNOSIS — I519 Heart disease, unspecified: Secondary | ICD-10-CM | POA: Diagnosis not present

## 2013-08-16 DIAGNOSIS — I2510 Atherosclerotic heart disease of native coronary artery without angina pectoris: Secondary | ICD-10-CM | POA: Diagnosis not present

## 2013-08-18 DIAGNOSIS — L820 Inflamed seborrheic keratosis: Secondary | ICD-10-CM | POA: Diagnosis not present

## 2013-08-18 DIAGNOSIS — L570 Actinic keratosis: Secondary | ICD-10-CM | POA: Diagnosis not present

## 2013-08-18 DIAGNOSIS — L819 Disorder of pigmentation, unspecified: Secondary | ICD-10-CM | POA: Diagnosis not present

## 2013-08-18 DIAGNOSIS — L821 Other seborrheic keratosis: Secondary | ICD-10-CM | POA: Diagnosis not present

## 2013-08-21 ENCOUNTER — Ambulatory Visit: Payer: Self-pay | Admitting: Internal Medicine

## 2013-08-26 DIAGNOSIS — I4891 Unspecified atrial fibrillation: Secondary | ICD-10-CM | POA: Diagnosis not present

## 2013-08-26 DIAGNOSIS — Z23 Encounter for immunization: Secondary | ICD-10-CM | POA: Diagnosis not present

## 2013-08-26 DIAGNOSIS — Z6831 Body mass index (BMI) 31.0-31.9, adult: Secondary | ICD-10-CM | POA: Diagnosis not present

## 2013-08-26 DIAGNOSIS — I2510 Atherosclerotic heart disease of native coronary artery without angina pectoris: Secondary | ICD-10-CM | POA: Diagnosis not present

## 2013-08-26 DIAGNOSIS — I10 Essential (primary) hypertension: Secondary | ICD-10-CM | POA: Diagnosis not present

## 2013-08-26 DIAGNOSIS — E785 Hyperlipidemia, unspecified: Secondary | ICD-10-CM | POA: Diagnosis not present

## 2013-09-06 DIAGNOSIS — I10 Essential (primary) hypertension: Secondary | ICD-10-CM | POA: Diagnosis not present

## 2013-09-06 DIAGNOSIS — G458 Other transient cerebral ischemic attacks and related syndromes: Secondary | ICD-10-CM | POA: Diagnosis not present

## 2013-10-04 DIAGNOSIS — I2510 Atherosclerotic heart disease of native coronary artery without angina pectoris: Secondary | ICD-10-CM | POA: Diagnosis not present

## 2013-10-04 DIAGNOSIS — I10 Essential (primary) hypertension: Secondary | ICD-10-CM | POA: Diagnosis not present

## 2013-11-17 DIAGNOSIS — K219 Gastro-esophageal reflux disease without esophagitis: Secondary | ICD-10-CM | POA: Diagnosis not present

## 2013-11-17 DIAGNOSIS — I2510 Atherosclerotic heart disease of native coronary artery without angina pectoris: Secondary | ICD-10-CM | POA: Diagnosis not present

## 2013-11-17 DIAGNOSIS — N183 Chronic kidney disease, stage 3 unspecified: Secondary | ICD-10-CM | POA: Diagnosis not present

## 2013-11-17 DIAGNOSIS — I10 Essential (primary) hypertension: Secondary | ICD-10-CM | POA: Diagnosis not present

## 2013-11-17 DIAGNOSIS — Z1331 Encounter for screening for depression: Secondary | ICD-10-CM | POA: Diagnosis not present

## 2013-11-17 DIAGNOSIS — E785 Hyperlipidemia, unspecified: Secondary | ICD-10-CM | POA: Diagnosis not present

## 2013-11-17 DIAGNOSIS — E781 Pure hyperglyceridemia: Secondary | ICD-10-CM | POA: Diagnosis not present

## 2013-11-17 DIAGNOSIS — I4891 Unspecified atrial fibrillation: Secondary | ICD-10-CM | POA: Diagnosis not present

## 2013-11-17 DIAGNOSIS — R7301 Impaired fasting glucose: Secondary | ICD-10-CM | POA: Diagnosis not present

## 2013-11-28 ENCOUNTER — Emergency Department: Payer: Self-pay | Admitting: Emergency Medicine

## 2013-11-28 DIAGNOSIS — I2510 Atherosclerotic heart disease of native coronary artery without angina pectoris: Secondary | ICD-10-CM | POA: Diagnosis not present

## 2013-11-28 DIAGNOSIS — I10 Essential (primary) hypertension: Secondary | ICD-10-CM | POA: Diagnosis not present

## 2013-11-28 DIAGNOSIS — I4891 Unspecified atrial fibrillation: Secondary | ICD-10-CM | POA: Diagnosis not present

## 2013-11-28 DIAGNOSIS — Z79899 Other long term (current) drug therapy: Secondary | ICD-10-CM | POA: Diagnosis not present

## 2013-11-28 DIAGNOSIS — R197 Diarrhea, unspecified: Secondary | ICD-10-CM | POA: Diagnosis not present

## 2013-11-28 DIAGNOSIS — R110 Nausea: Secondary | ICD-10-CM | POA: Diagnosis not present

## 2013-11-28 DIAGNOSIS — R5381 Other malaise: Secondary | ICD-10-CM | POA: Diagnosis not present

## 2013-11-28 DIAGNOSIS — R5383 Other fatigue: Secondary | ICD-10-CM | POA: Diagnosis not present

## 2013-11-28 DIAGNOSIS — I252 Old myocardial infarction: Secondary | ICD-10-CM | POA: Diagnosis not present

## 2013-11-28 LAB — CBC WITH DIFFERENTIAL/PLATELET
BASOS ABS: 0 10*3/uL (ref 0.0–0.1)
Basophil %: 0.8 %
EOS PCT: 1 %
Eosinophil #: 0 10*3/uL (ref 0.0–0.7)
HCT: 40.9 % (ref 40.0–52.0)
HGB: 13.4 g/dL (ref 13.0–18.0)
Lymphocyte #: 1.1 10*3/uL (ref 1.0–3.6)
Lymphocyte %: 23.1 %
MCH: 30.9 pg (ref 26.0–34.0)
MCHC: 32.7 g/dL (ref 32.0–36.0)
MCV: 95 fL (ref 80–100)
MONO ABS: 0.5 x10 3/mm (ref 0.2–1.0)
MONOS PCT: 9.9 %
NEUTROS ABS: 3 10*3/uL (ref 1.4–6.5)
Neutrophil %: 65.2 %
PLATELETS: 103 10*3/uL — AB (ref 150–440)
RBC: 4.33 10*6/uL — AB (ref 4.40–5.90)
RDW: 17.1 % — ABNORMAL HIGH (ref 11.5–14.5)
WBC: 4.6 10*3/uL (ref 3.8–10.6)

## 2013-11-28 LAB — URINALYSIS, COMPLETE
Bilirubin,UR: NEGATIVE
GLUCOSE, UR: NEGATIVE mg/dL (ref 0–75)
Ketone: NEGATIVE
LEUKOCYTE ESTERASE: NEGATIVE
NITRITE: NEGATIVE
PH: 5 (ref 4.5–8.0)
SPECIFIC GRAVITY: 1.027 (ref 1.003–1.030)

## 2013-11-28 LAB — HEPATIC FUNCTION PANEL A (ARMC)
ALK PHOS: 86 U/L
Albumin: 3.7 g/dL (ref 3.4–5.0)
BILIRUBIN TOTAL: 0.9 mg/dL (ref 0.2–1.0)
Bilirubin, Direct: 0.3 mg/dL — ABNORMAL HIGH (ref 0.00–0.20)
SGOT(AST): 38 U/L — ABNORMAL HIGH (ref 15–37)
SGPT (ALT): 27 U/L (ref 12–78)
Total Protein: 7.6 g/dL (ref 6.4–8.2)

## 2013-11-28 LAB — BASIC METABOLIC PANEL
ANION GAP: 8 (ref 7–16)
BUN: 13 mg/dL (ref 7–18)
CHLORIDE: 107 mmol/L (ref 98–107)
Calcium, Total: 9 mg/dL (ref 8.5–10.1)
Co2: 24 mmol/L (ref 21–32)
Creatinine: 1.16 mg/dL (ref 0.60–1.30)
GFR CALC NON AF AMER: 57 — AB
Glucose: 92 mg/dL (ref 65–99)
Osmolality: 277 (ref 275–301)
Potassium: 4 mmol/L (ref 3.5–5.1)
SODIUM: 139 mmol/L (ref 136–145)

## 2013-11-28 LAB — LIPASE, BLOOD: Lipase: 214 U/L (ref 73–393)

## 2013-11-28 LAB — TROPONIN I: Troponin-I: <0.02 ng/mL

## 2013-12-01 DIAGNOSIS — A088 Other specified intestinal infections: Secondary | ICD-10-CM | POA: Diagnosis not present

## 2013-12-01 DIAGNOSIS — R7301 Impaired fasting glucose: Secondary | ICD-10-CM | POA: Diagnosis not present

## 2013-12-01 DIAGNOSIS — Z6831 Body mass index (BMI) 31.0-31.9, adult: Secondary | ICD-10-CM | POA: Diagnosis not present

## 2013-12-01 DIAGNOSIS — I4891 Unspecified atrial fibrillation: Secondary | ICD-10-CM | POA: Diagnosis not present

## 2013-12-01 DIAGNOSIS — I10 Essential (primary) hypertension: Secondary | ICD-10-CM | POA: Diagnosis not present

## 2014-01-19 DIAGNOSIS — R350 Frequency of micturition: Secondary | ICD-10-CM | POA: Diagnosis not present

## 2014-01-19 DIAGNOSIS — R3915 Urgency of urination: Secondary | ICD-10-CM | POA: Diagnosis not present

## 2014-01-19 DIAGNOSIS — C679 Malignant neoplasm of bladder, unspecified: Secondary | ICD-10-CM | POA: Diagnosis not present

## 2014-01-25 ENCOUNTER — Ambulatory Visit: Payer: Self-pay | Admitting: Internal Medicine

## 2014-02-08 DIAGNOSIS — I10 Essential (primary) hypertension: Secondary | ICD-10-CM | POA: Diagnosis not present

## 2014-02-08 DIAGNOSIS — R002 Palpitations: Secondary | ICD-10-CM | POA: Diagnosis not present

## 2014-02-08 DIAGNOSIS — I4891 Unspecified atrial fibrillation: Secondary | ICD-10-CM | POA: Diagnosis not present

## 2014-02-11 DIAGNOSIS — H4010X0 Unspecified open-angle glaucoma, stage unspecified: Secondary | ICD-10-CM | POA: Diagnosis not present

## 2014-02-14 DIAGNOSIS — L821 Other seborrheic keratosis: Secondary | ICD-10-CM | POA: Diagnosis not present

## 2014-02-14 DIAGNOSIS — D1801 Hemangioma of skin and subcutaneous tissue: Secondary | ICD-10-CM | POA: Diagnosis not present

## 2014-02-14 DIAGNOSIS — L570 Actinic keratosis: Secondary | ICD-10-CM | POA: Diagnosis not present

## 2014-05-12 DIAGNOSIS — R7301 Impaired fasting glucose: Secondary | ICD-10-CM | POA: Diagnosis not present

## 2014-05-12 DIAGNOSIS — E785 Hyperlipidemia, unspecified: Secondary | ICD-10-CM | POA: Diagnosis not present

## 2014-05-12 DIAGNOSIS — Z125 Encounter for screening for malignant neoplasm of prostate: Secondary | ICD-10-CM | POA: Diagnosis not present

## 2014-05-12 DIAGNOSIS — N183 Chronic kidney disease, stage 3 unspecified: Secondary | ICD-10-CM | POA: Diagnosis not present

## 2014-05-12 DIAGNOSIS — I10 Essential (primary) hypertension: Secondary | ICD-10-CM | POA: Diagnosis not present

## 2014-05-12 DIAGNOSIS — Z79899 Other long term (current) drug therapy: Secondary | ICD-10-CM | POA: Diagnosis not present

## 2014-05-19 DIAGNOSIS — I4891 Unspecified atrial fibrillation: Secondary | ICD-10-CM | POA: Diagnosis not present

## 2014-05-19 DIAGNOSIS — E781 Pure hyperglyceridemia: Secondary | ICD-10-CM | POA: Diagnosis not present

## 2014-05-19 DIAGNOSIS — N183 Chronic kidney disease, stage 3 unspecified: Secondary | ICD-10-CM | POA: Diagnosis not present

## 2014-05-19 DIAGNOSIS — R7301 Impaired fasting glucose: Secondary | ICD-10-CM | POA: Diagnosis not present

## 2014-05-19 DIAGNOSIS — E785 Hyperlipidemia, unspecified: Secondary | ICD-10-CM | POA: Diagnosis not present

## 2014-05-19 DIAGNOSIS — Z0000 Encounter for general adult medical examination without abnormal findings: Secondary | ICD-10-CM | POA: Diagnosis not present

## 2014-05-19 DIAGNOSIS — I10 Essential (primary) hypertension: Secondary | ICD-10-CM | POA: Diagnosis not present

## 2014-05-19 DIAGNOSIS — Z7901 Long term (current) use of anticoagulants: Secondary | ICD-10-CM | POA: Diagnosis not present

## 2014-05-23 DIAGNOSIS — Z1212 Encounter for screening for malignant neoplasm of rectum: Secondary | ICD-10-CM | POA: Diagnosis not present

## 2014-07-25 DIAGNOSIS — H4010X1 Unspecified open-angle glaucoma, mild stage: Secondary | ICD-10-CM | POA: Diagnosis not present

## 2014-08-10 DIAGNOSIS — H4010X1 Unspecified open-angle glaucoma, mild stage: Secondary | ICD-10-CM | POA: Diagnosis not present

## 2014-08-23 DIAGNOSIS — E785 Hyperlipidemia, unspecified: Secondary | ICD-10-CM | POA: Diagnosis not present

## 2014-08-23 DIAGNOSIS — I252 Old myocardial infarction: Secondary | ICD-10-CM | POA: Diagnosis not present

## 2014-08-23 DIAGNOSIS — I10 Essential (primary) hypertension: Secondary | ICD-10-CM | POA: Diagnosis not present

## 2014-08-23 DIAGNOSIS — I2510 Atherosclerotic heart disease of native coronary artery without angina pectoris: Secondary | ICD-10-CM | POA: Diagnosis not present

## 2014-09-21 DIAGNOSIS — L570 Actinic keratosis: Secondary | ICD-10-CM | POA: Diagnosis not present

## 2014-09-21 DIAGNOSIS — L821 Other seborrheic keratosis: Secondary | ICD-10-CM | POA: Diagnosis not present

## 2015-02-10 NOTE — Op Note (Signed)
PATIENT NAME:  Omar Khan, Omar Khan MR#:  [id number] DATE OF BIRTH:  07-May-1925  DATE OF PROCEDURE:  10/26/2012  DATE OF DICTATION: 10/26/2012   PREOPERATIVE DIAGNOSIS: Cataract, right eye.   POSTOPERATIVE DIAGNOSIS: Cataract, right eye.   PROCEDURE PERFORMED: Extracapsular cataract extraction using phacoemulsification with placement of Alcon [id number], 18.0 diopter posterior chamber lens, serial number [id number].   SURGEON: Loura Back. Quindarius Cabello, MD   ANESTHESIA: 4% lidocaine and 0.75% Marcaine in a 50:50 mixture with 10 units/mL of Hylenex added, given as a peribulbar.   ANESTHESIOLOGIST: Dr. Andree Elk.   COMPLICATIONS: None.   ESTIMATED BLOOD LOSS: Less than 1 mL.   DESCRIPTION OF PROCEDURE: The patient was brought to the operating room and given a peribulbar block. The patient was then prepped and draped in the usual fashion. The vertical rectus muscles were imbricated using 5-0 silk sutures. These sutures were then clamped to the sterile drapes as bridle sutures. A limbal peritomy was performed extending two clock hours and hemostasis was obtained with cautery.  A partial thickness scleral groove was made at the surgical limbus and then dissected anteriorly in a lamellar dissection with using an Alcon crescent knife. The anterior chamber was entered superonasally with a Superblade and through the lamellar dissection with a 2.6-mm keratome.  DisCoVisc was used to replace the aqueous and a continuous tear capsulorrhexis was carried out. Hydrodissection and hydrodelineation were carried out with balanced salt and a 27 gauge canula, and 0.1 mL of cefuroxime was injected into the anterior chamber. . The nucleus was rotated to confirm the effectiveness of the hydrodissection. Phacoemulsification was carried out using a divide-and-conquer technique. Total ultrasound time was 1 minute and 4 seconds with an average power of 19.7%. CDE of 22.19.   Irrigation/aspiration was used to remove the residual  cortex. DisCoVisc was used to inflate the capsule and the internal wound was enlarged to 3 mm with the crescent knife. The intraocular lens was inserted into the capsular bag using the AcrySert delivery system. Irrigation/aspiration was used to remove the residual DisCoVisc.  Miostat was injected into the anterior chamber through the paracentesis track to inflate the anterior chamber and induce miosis. The wound was checked for leaks and wound leakage was found. A single 10-0 suture was placed across the incision, tied and the knot was rotated superiorly. The conjunctiva was closed with cautery and the bridle sutures were removed. Two drops of 0.3% Vigamox were placed on the eye. An eye shield was placed on the eye.  The patient was discharged to the recovery room in good condition.   ____________________________ Loura Back Hellena Pridgen, MD sad:OSi D: 10/26/2012 13:40:52 ET T: 10/27/2012 05:56:57 ET JOB#: [id number]  cc: Remo Lipps A. Jeanann Balinski, MD, <Dictator> Martie Lee MD ELECTRONICALLY SIGNED 11/02/2012 13:10

## 2015-02-10 NOTE — Op Note (Signed)
PATIENT NAME:  Omar Khan, Omar Khan MR#:  [id number] DATE OF BIRTH:  [birth year]  DATE OF PROCEDURE:  02/08/2013  PREOPERATIVE DIAGNOSIS: Cataract,  left eye.   POSTOPERATIVE DIAGNOSIS: Cataract,  left eye.   PROCEDURE PERFORMED: Extracapsular cataract extraction using phacoemulsification with placement of an Alcon [id number] 18.0-diopter posterior chamber lens, serial [id number].   SURGEON: Loura Back. Chieko Neises, M.D.   ANESTHESIA: 4% lidocaine and 0.75% Marcaine, a 50:50 mixture with 10 units/mL of Hylenex added given as a peribulbar.   ANESTHESIOLOGIST: Dr. Benjamine Mola.   COMPLICATIONS: None.   ESTIMATED BLOOD LOSS: Less than 1 mL.   DESCRIPTION OF PROCEDURE:  The patient was brought to the operating room and given a peribulbar block.  The patient was then prepped and draped in the usual fashion.  The vertical rectus muscles were imbricated using 5-0 silk sutures.  These sutures were then clamped to the sterile drapes as bridle sutures.  A limbal peritomy was performed extending two clock hours and hemostasis was obtained with cautery.  A partial thickness scleral groove was made at the surgical limbus and dissected anteriorly in a lamellar dissection using an Alcon crescent knife.  The anterior chamber was entered supero-temporally with a Superblade and through the lamellar dissection with a 2.6 mm keratome.  DisCoVisc was used to replace the aqueous and a continuous tear capsulorrhexis was carried out.  Hydrodissection and hydrodelineation were carried out with balanced salt and a 27 gauge canula.  The nucleus was rotated to confirm the effectiveness of the hydrodissection.  Phacoemulsification was carried out using a divide-and-conquer technique.  Total ultrasound time was 54 seconds, average power of 22.9%, CDE of 22.54.  Irrigation/aspiration was used to remove the residual cortex.  DisCoVisc was used to inflate the capsule and the internal incision was enlarged to 3 mm with the crescent knife.   The intraocular lens was folded and inserted into the capsular bag using the AcrySert delivery system Irrigation/aspiration was used to remove the residual DisCoVisc.  An AcrySert delivery system was used instead of a Monarch . Irrigation/aspiration was used to remove. A tenth of a milliliter of cefuroxime was injected into the anterior chamber via the paracentesis tract.   Miostat was injected into the anterior chamber through the paracentesis track to inflate the anterior chamber and induce miosis.  The wound was checked for leaks and wound leakage was found.  A single 10-0 suture was placed across the incision, tied and the knot was rotated superiorly.  The conjunctiva was closed with cautery and the bridle sutures were removed.  Two drops of 0.3% Vigamox were placed on the eye.   An eye shield was placed on the eye.    ____________________________ Loura Back. Klohe Lovering, MD sad:dm D: 02/08/2013 14:24:23 ET T: 02/08/2013 15:49:07 ET JOB#: [id number]  cc: Remo Lipps A. Fionnuala Hemmerich, MD, <Dictator> Martie Lee MD ELECTRONICALLY SIGNED 02/15/2013 14:21

## 2015-11-02 DIAGNOSIS — L821 Other seborrheic keratosis: Secondary | ICD-10-CM | POA: Diagnosis not present

## 2015-11-02 DIAGNOSIS — L400 Psoriasis vulgaris: Secondary | ICD-10-CM | POA: Diagnosis not present

## 2015-11-22 DIAGNOSIS — L821 Other seborrheic keratosis: Secondary | ICD-10-CM | POA: Diagnosis not present

## 2015-11-22 DIAGNOSIS — X32XXXA Exposure to sunlight, initial encounter: Secondary | ICD-10-CM | POA: Diagnosis not present

## 2015-11-22 DIAGNOSIS — L570 Actinic keratosis: Secondary | ICD-10-CM | POA: Diagnosis not present

## 2015-11-22 DIAGNOSIS — D485 Neoplasm of uncertain behavior of skin: Secondary | ICD-10-CM | POA: Diagnosis not present

## 2015-11-22 DIAGNOSIS — Z85828 Personal history of other malignant neoplasm of skin: Secondary | ICD-10-CM | POA: Diagnosis not present

## 2015-11-22 DIAGNOSIS — L400 Psoriasis vulgaris: Secondary | ICD-10-CM | POA: Diagnosis not present

## 2015-11-23 DIAGNOSIS — C44722 Squamous cell carcinoma of skin of right lower limb, including hip: Secondary | ICD-10-CM | POA: Diagnosis not present

## 2016-01-02 DIAGNOSIS — Z85828 Personal history of other malignant neoplasm of skin: Secondary | ICD-10-CM | POA: Diagnosis not present

## 2016-01-02 DIAGNOSIS — D485 Neoplasm of uncertain behavior of skin: Secondary | ICD-10-CM | POA: Diagnosis not present

## 2016-01-02 DIAGNOSIS — Z08 Encounter for follow-up examination after completed treatment for malignant neoplasm: Secondary | ICD-10-CM | POA: Diagnosis not present

## 2016-01-02 DIAGNOSIS — L400 Psoriasis vulgaris: Secondary | ICD-10-CM | POA: Diagnosis not present

## 2016-01-12 DIAGNOSIS — C44722 Squamous cell carcinoma of skin of right lower limb, including hip: Secondary | ICD-10-CM | POA: Diagnosis not present

## 2016-02-21 DIAGNOSIS — R600 Localized edema: Secondary | ICD-10-CM | POA: Diagnosis not present

## 2016-02-21 DIAGNOSIS — R0602 Shortness of breath: Secondary | ICD-10-CM | POA: Diagnosis not present

## 2016-02-21 DIAGNOSIS — I639 Cerebral infarction, unspecified: Secondary | ICD-10-CM | POA: Diagnosis not present

## 2016-02-21 DIAGNOSIS — K219 Gastro-esophageal reflux disease without esophagitis: Secondary | ICD-10-CM | POA: Diagnosis not present

## 2016-02-21 DIAGNOSIS — G510 Bell's palsy: Secondary | ICD-10-CM | POA: Diagnosis not present

## 2016-02-21 DIAGNOSIS — J439 Emphysema, unspecified: Secondary | ICD-10-CM | POA: Diagnosis not present

## 2016-02-21 DIAGNOSIS — I10 Essential (primary) hypertension: Secondary | ICD-10-CM | POA: Diagnosis not present

## 2016-02-21 DIAGNOSIS — E785 Hyperlipidemia, unspecified: Secondary | ICD-10-CM | POA: Diagnosis not present

## 2016-02-21 DIAGNOSIS — I480 Paroxysmal atrial fibrillation: Secondary | ICD-10-CM | POA: Diagnosis not present

## 2016-02-21 DIAGNOSIS — R05 Cough: Secondary | ICD-10-CM | POA: Diagnosis not present

## 2016-02-21 DIAGNOSIS — J40 Bronchitis, not specified as acute or chronic: Secondary | ICD-10-CM | POA: Diagnosis not present

## 2016-02-21 DIAGNOSIS — F411 Generalized anxiety disorder: Secondary | ICD-10-CM | POA: Diagnosis not present

## 2016-03-15 ENCOUNTER — Ambulatory Visit (INDEPENDENT_AMBULATORY_CARE_PROVIDER_SITE_OTHER): Payer: PPO | Admitting: Urology

## 2016-03-15 ENCOUNTER — Encounter: Payer: Self-pay | Admitting: Urology

## 2016-03-15 VITALS — BP 132/74 | HR 93 | Ht 65.5 in | Wt 175.0 lb

## 2016-03-15 DIAGNOSIS — R310 Gross hematuria: Secondary | ICD-10-CM | POA: Diagnosis not present

## 2016-03-15 DIAGNOSIS — R3911 Hesitancy of micturition: Secondary | ICD-10-CM

## 2016-03-15 DIAGNOSIS — F419 Anxiety disorder, unspecified: Secondary | ICD-10-CM | POA: Insufficient documentation

## 2016-03-15 DIAGNOSIS — Z8551 Personal history of malignant neoplasm of bladder: Secondary | ICD-10-CM | POA: Diagnosis not present

## 2016-03-15 DIAGNOSIS — E785 Hyperlipidemia, unspecified: Secondary | ICD-10-CM | POA: Insufficient documentation

## 2016-03-15 DIAGNOSIS — I10 Essential (primary) hypertension: Secondary | ICD-10-CM | POA: Insufficient documentation

## 2016-03-15 DIAGNOSIS — I4891 Unspecified atrial fibrillation: Secondary | ICD-10-CM | POA: Insufficient documentation

## 2016-03-15 DIAGNOSIS — C679 Malignant neoplasm of bladder, unspecified: Secondary | ICD-10-CM | POA: Diagnosis not present

## 2016-03-15 DIAGNOSIS — I639 Cerebral infarction, unspecified: Secondary | ICD-10-CM | POA: Insufficient documentation

## 2016-03-15 DIAGNOSIS — I219 Acute myocardial infarction, unspecified: Secondary | ICD-10-CM | POA: Insufficient documentation

## 2016-03-15 LAB — URINALYSIS, COMPLETE
BILIRUBIN UA: NEGATIVE
Glucose, UA: NEGATIVE
KETONES UA: NEGATIVE
Leukocytes, UA: NEGATIVE
Nitrite, UA: NEGATIVE
PH UA: 6.5 (ref 5.0–7.5)
Protein, UA: NEGATIVE
SPEC GRAV UA: 1.01 (ref 1.005–1.030)
Urobilinogen, Ur: 0.2 mg/dL (ref 0.2–1.0)

## 2016-03-15 LAB — MICROSCOPIC EXAMINATION
BACTERIA UA: NONE SEEN
EPITHELIAL CELLS (NON RENAL): NONE SEEN /HPF (ref 0–10)

## 2016-03-15 MED ORDER — LIDOCAINE HCL 2 % EX GEL
1.0000 "application " | Freq: Once | CUTANEOUS | Status: AC
  Administered 2016-03-15: 1 via URETHRAL

## 2016-03-15 MED ORDER — CIPROFLOXACIN HCL 500 MG PO TABS
500.0000 mg | ORAL_TABLET | Freq: Once | ORAL | Status: AC
  Administered 2016-03-15: 500 mg via ORAL

## 2016-03-15 NOTE — Progress Notes (Signed)
03/15/2016 10:25 AM   Omar Khan [id number]  Referring provider: Marton Redwood, MD [address] [place], [place] [address]  Chief Complaint  Patient presents with  . Cysto    HPI: 82 year old male with history of low-grade TA bladder cancer diagnosed in February 2012.  At that time,  his tumor was 2 cm on the right lateral base. CT urogram at the time was otherwise negative.  He has been undergoing annual cystoscopy.  Last cystoscopy in 02/2015 was negative.  He has had one episode of gross hematuria approximately 2 months ago which was very brief. He is on Eliquis.  There is no associated dysuria. The episode was self-limited and resolved quickly. He had no recurrence since.  In terms of urinary symptoms, he does complain today of occasional difficulty starting his stream which is new since last year. He does feel like he is able to empty all of the way. He has some urgency. Minimal nocturia. No urinary tract infections.  Not currently on any BPH medications.  His last prostate cancer screening was in April 2015 which was negative for any nodules.   PMH: Past Medical History  Diagnosis Date  . GERD (gastroesophageal reflux disease)   . Heart attack ([place])   . Spinal stenosis   . Hiatal hernia   . Hypertension   . Atrial fibrillation ([place])   . Depression   . Glaucoma   . Anxiety   . Bell's palsy   . Gross hematuria   . Enlarged prostate with lower urinary tract symptoms (LUTS)     Surgical History: Past Surgical History  Procedure Laterality Date  . Back surgery    . Tonsillectomy    . Cystoscopy with fulgeration      medium lesion 2-5cm  . Hemorrhoid surgery    . Total hip arthroplasty      Home Medications:    Medication List       This list is accurate as of: 03/15/16 10:25 AM.  Always use your most recent med list.               ALPRAZolam 0.5 MG tablet  Commonly known as:  XANAX  Take 0.5 mg by mouth at bedtime as needed.     atorvastatin 20 MG tablet  Commonly known as:  LIPITOR  Take by mouth.     cloNIDine 0.1 MG tablet  Commonly known as:  CATAPRES     diltiazem 240 MG 24 hr capsule  Commonly known as:  CARDIZEM CD     ELIQUIS 2.5 MG Tabs tablet  Generic drug:  apixaban  Take by mouth.     fluticasone 50 MCG/ACT nasal spray  Commonly known as:  FLONASE  Place 2 sprays into the nose daily.     furosemide 40 MG tablet  Commonly known as:  LASIX     LATANOPROST OP  Apply to eye at bedtime.     NEXIUM 24HR 20 MG capsule  Generic drug:  esomeprazole  Take by mouth.     PARoxetine 20 MG tablet  Commonly known as:  PAXIL  Take 20 mg by mouth daily.        Allergies: No Known Allergies  Family History: Family History  Problem Relation Age of Onset  . Heart attack Father     Social History:  reports that he has quit smoking. He does not have any smokeless tobacco history on file. He reports that he does not drink alcohol or use  illicit drugs.  Physical Exam: BP 132/74 mmHg  Pulse 93  Ht 5' 5.5" (1.664 m)  Wt 175 lb (79.379 kg)  BMI 28.67 kg/m2  Constitutional:  Alert and oriented, No acute distress. HEENT: [place] AT, moist mucus membranes.  Trachea midline, no masses. Cardiovascular: No clubbing, cyanosis, or edema. Respiratory: Normal respiratory effort, no increased work of breathing. GI: Abdomen is soft, nontender, nondistended, no abdominal masses GU: Circumcised phallus with orthotopic meatus. Bilateral testicles descended. Skin: No rashes, bruises or suspicious lesions. Neurologic: Grossly intact, no focal deficits, moving all 4 extremities.  Facial asymmetry noted. Psychiatric: Normal mood and affect.  Laboratory Data: Lab Results  Component Value Date   WBC 4.6 11/28/2013   HGB 13.4 11/28/2013   HCT 40.9 11/28/2013   MCV 95 11/28/2013   PLT 103* 11/28/2013    Lab Results  Component Value Date   CREATININE 1.16 11/28/2013    Urinalysis UA reviewed, see  EPIC  Cystoscopy Procedure Note  Patient identification was confirmed, informed consent was obtained, and patient was prepped using Betadine solution.  Lidocaine jelly was administered per urethral meatus.    Preoperative abx where received prior to procedure.     Pre-Procedure: - Inspection reveals a normal caliber ureteral meatus.  Procedure: The flexible cystoscope was introduced without difficulty - No urethral strictures/lesions are present. - Enlarged prostate with trilobar coapation, 5 cm length - Elevated (mildy) bladder neck - Bilateral ureteral orifices identified - Bladder mucosa  reveals no ulcers, tumors, or lesions - No bladder stones - No trabeculation  Retroflexion shows small    Post-Procedure: - Patient tolerated the procedure well   Assessment & Plan:    1. History of bladder cancer Cystoscopy negative today. Low risk bladder cancer, single occurrence in 2012. We discussed and it shared decision-making manner whether not to continue to proceed with annual cystoscopy. He would like to proceed with this. Plan for repeat cystoscopy in one year. - Urinalysis, Complete - ciprofloxacin (CIPRO) tablet 500 mg; Take 1 tablet (500 mg total) by mouth once. - lidocaine (XYLOCAINE) 2 % jelly 1 application; Place 1 application into the urethra once.  2. Gross hematuria Isolated episode of gross hematuria in the setting of anticoagulation. Cystoscopy today negative. Given his age and other comorbidities, no indication for upper tract imaging.  3. Urinary hesitancy Discussed options for treatment of his urinary symptoms. He is not interested in any further medical management at this time. He was encouraged to return sooner if he elects to pursue this.   Return in about 1 year (around 03/15/2017) for annual cysto.  Hollice Espy, MD  [facility] [address] [place], [place] [address] [phone]

## 2016-04-16 DIAGNOSIS — H401131 Primary open-angle glaucoma, bilateral, mild stage: Secondary | ICD-10-CM | POA: Diagnosis not present

## 2016-06-03 DIAGNOSIS — I10 Essential (primary) hypertension: Secondary | ICD-10-CM | POA: Diagnosis not present

## 2016-06-03 DIAGNOSIS — Z08 Encounter for follow-up examination after completed treatment for malignant neoplasm: Secondary | ICD-10-CM | POA: Diagnosis not present

## 2016-06-03 DIAGNOSIS — Z85828 Personal history of other malignant neoplasm of skin: Secondary | ICD-10-CM | POA: Diagnosis not present

## 2016-06-03 DIAGNOSIS — L400 Psoriasis vulgaris: Secondary | ICD-10-CM | POA: Diagnosis not present

## 2016-06-03 DIAGNOSIS — R7301 Impaired fasting glucose: Secondary | ICD-10-CM | POA: Diagnosis not present

## 2016-06-03 DIAGNOSIS — E784 Other hyperlipidemia: Secondary | ICD-10-CM | POA: Diagnosis not present

## 2016-06-03 DIAGNOSIS — D485 Neoplasm of uncertain behavior of skin: Secondary | ICD-10-CM | POA: Diagnosis not present

## 2016-06-03 DIAGNOSIS — D0471 Carcinoma in situ of skin of right lower limb, including hip: Secondary | ICD-10-CM | POA: Diagnosis not present

## 2016-06-03 DIAGNOSIS — D0472 Carcinoma in situ of skin of left lower limb, including hip: Secondary | ICD-10-CM | POA: Diagnosis not present

## 2016-06-10 DIAGNOSIS — Z7189 Other specified counseling: Secondary | ICD-10-CM | POA: Diagnosis not present

## 2016-06-10 DIAGNOSIS — Z6830 Body mass index (BMI) 30.0-30.9, adult: Secondary | ICD-10-CM | POA: Diagnosis not present

## 2016-06-10 DIAGNOSIS — I480 Paroxysmal atrial fibrillation: Secondary | ICD-10-CM | POA: Diagnosis not present

## 2016-06-10 DIAGNOSIS — N183 Chronic kidney disease, stage 3 (moderate): Secondary | ICD-10-CM | POA: Diagnosis not present

## 2016-06-10 DIAGNOSIS — Z1389 Encounter for screening for other disorder: Secondary | ICD-10-CM | POA: Diagnosis not present

## 2016-06-10 DIAGNOSIS — I2510 Atherosclerotic heart disease of native coronary artery without angina pectoris: Secondary | ICD-10-CM | POA: Diagnosis not present

## 2016-06-10 DIAGNOSIS — F325 Major depressive disorder, single episode, in full remission: Secondary | ICD-10-CM | POA: Diagnosis not present

## 2016-06-10 DIAGNOSIS — Z0000 Encounter for general adult medical examination without abnormal findings: Secondary | ICD-10-CM | POA: Diagnosis not present

## 2016-06-10 DIAGNOSIS — D696 Thrombocytopenia, unspecified: Secondary | ICD-10-CM | POA: Diagnosis not present

## 2016-06-10 DIAGNOSIS — I129 Hypertensive chronic kidney disease with stage 1 through stage 4 chronic kidney disease, or unspecified chronic kidney disease: Secondary | ICD-10-CM | POA: Diagnosis not present

## 2016-06-10 DIAGNOSIS — Z7901 Long term (current) use of anticoagulants: Secondary | ICD-10-CM | POA: Diagnosis not present

## 2016-06-10 DIAGNOSIS — I5022 Chronic systolic (congestive) heart failure: Secondary | ICD-10-CM | POA: Diagnosis not present

## 2016-06-10 DIAGNOSIS — I10 Essential (primary) hypertension: Secondary | ICD-10-CM | POA: Diagnosis not present

## 2016-06-10 DIAGNOSIS — E784 Other hyperlipidemia: Secondary | ICD-10-CM | POA: Diagnosis not present

## 2016-07-09 DIAGNOSIS — D0471 Carcinoma in situ of skin of right lower limb, including hip: Secondary | ICD-10-CM | POA: Diagnosis not present

## 2016-07-09 DIAGNOSIS — D0472 Carcinoma in situ of skin of left lower limb, including hip: Secondary | ICD-10-CM | POA: Diagnosis not present

## 2016-10-08 DIAGNOSIS — H401131 Primary open-angle glaucoma, bilateral, mild stage: Secondary | ICD-10-CM | POA: Diagnosis not present

## 2016-11-11 DIAGNOSIS — X32XXXA Exposure to sunlight, initial encounter: Secondary | ICD-10-CM | POA: Diagnosis not present

## 2016-11-11 DIAGNOSIS — Z85828 Personal history of other malignant neoplasm of skin: Secondary | ICD-10-CM | POA: Diagnosis not present

## 2016-11-11 DIAGNOSIS — B079 Viral wart, unspecified: Secondary | ICD-10-CM | POA: Diagnosis not present

## 2016-11-11 DIAGNOSIS — L570 Actinic keratosis: Secondary | ICD-10-CM | POA: Diagnosis not present

## 2016-11-11 DIAGNOSIS — L400 Psoriasis vulgaris: Secondary | ICD-10-CM | POA: Diagnosis not present

## 2016-11-11 DIAGNOSIS — Z08 Encounter for follow-up examination after completed treatment for malignant neoplasm: Secondary | ICD-10-CM | POA: Diagnosis not present

## 2016-11-11 DIAGNOSIS — D485 Neoplasm of uncertain behavior of skin: Secondary | ICD-10-CM | POA: Diagnosis not present

## 2016-11-11 DIAGNOSIS — C44722 Squamous cell carcinoma of skin of right lower limb, including hip: Secondary | ICD-10-CM | POA: Diagnosis not present

## 2016-11-18 DIAGNOSIS — H401131 Primary open-angle glaucoma, bilateral, mild stage: Secondary | ICD-10-CM | POA: Diagnosis not present

## 2016-12-02 DIAGNOSIS — G510 Bell's palsy: Secondary | ICD-10-CM | POA: Diagnosis not present

## 2016-12-02 DIAGNOSIS — R0602 Shortness of breath: Secondary | ICD-10-CM | POA: Diagnosis not present

## 2016-12-02 DIAGNOSIS — E785 Hyperlipidemia, unspecified: Secondary | ICD-10-CM | POA: Diagnosis not present

## 2016-12-02 DIAGNOSIS — F411 Generalized anxiety disorder: Secondary | ICD-10-CM | POA: Diagnosis not present

## 2016-12-02 DIAGNOSIS — R600 Localized edema: Secondary | ICD-10-CM | POA: Diagnosis not present

## 2016-12-02 DIAGNOSIS — I10 Essential (primary) hypertension: Secondary | ICD-10-CM | POA: Diagnosis not present

## 2016-12-02 DIAGNOSIS — K219 Gastro-esophageal reflux disease without esophagitis: Secondary | ICD-10-CM | POA: Diagnosis not present

## 2016-12-02 DIAGNOSIS — I639 Cerebral infarction, unspecified: Secondary | ICD-10-CM | POA: Diagnosis not present

## 2016-12-02 DIAGNOSIS — L409 Psoriasis, unspecified: Secondary | ICD-10-CM | POA: Diagnosis not present

## 2016-12-02 DIAGNOSIS — I480 Paroxysmal atrial fibrillation: Secondary | ICD-10-CM | POA: Diagnosis not present

## 2016-12-13 DIAGNOSIS — C44722 Squamous cell carcinoma of skin of right lower limb, including hip: Secondary | ICD-10-CM | POA: Diagnosis not present

## 2017-03-12 DIAGNOSIS — L400 Psoriasis vulgaris: Secondary | ICD-10-CM | POA: Diagnosis not present

## 2017-03-12 DIAGNOSIS — Z08 Encounter for follow-up examination after completed treatment for malignant neoplasm: Secondary | ICD-10-CM | POA: Diagnosis not present

## 2017-03-12 DIAGNOSIS — S40261A Insect bite (nonvenomous) of right shoulder, initial encounter: Secondary | ICD-10-CM | POA: Diagnosis not present

## 2017-03-12 DIAGNOSIS — D0461 Carcinoma in situ of skin of right upper limb, including shoulder: Secondary | ICD-10-CM | POA: Diagnosis not present

## 2017-03-12 DIAGNOSIS — L570 Actinic keratosis: Secondary | ICD-10-CM | POA: Diagnosis not present

## 2017-03-12 DIAGNOSIS — Z85828 Personal history of other malignant neoplasm of skin: Secondary | ICD-10-CM | POA: Diagnosis not present

## 2017-03-12 DIAGNOSIS — D485 Neoplasm of uncertain behavior of skin: Secondary | ICD-10-CM | POA: Diagnosis not present

## 2017-03-12 DIAGNOSIS — X32XXXA Exposure to sunlight, initial encounter: Secondary | ICD-10-CM | POA: Diagnosis not present

## 2017-03-19 ENCOUNTER — Ambulatory Visit (INDEPENDENT_AMBULATORY_CARE_PROVIDER_SITE_OTHER): Payer: PPO | Admitting: Urology

## 2017-03-19 ENCOUNTER — Encounter: Payer: Self-pay | Admitting: Urology

## 2017-03-19 VITALS — BP 147/63 | HR 91 | Ht 66.0 in | Wt 182.0 lb

## 2017-03-19 DIAGNOSIS — Z8551 Personal history of malignant neoplasm of bladder: Secondary | ICD-10-CM | POA: Diagnosis not present

## 2017-03-19 MED ORDER — CIPROFLOXACIN HCL 500 MG PO TABS
500.0000 mg | ORAL_TABLET | Freq: Once | ORAL | Status: AC
  Administered 2017-03-19: 500 mg via ORAL

## 2017-03-19 MED ORDER — LIDOCAINE HCL 2 % EX GEL
1.0000 | Freq: Once | CUTANEOUS | Status: AC
Start: 2017-03-19 — End: 2017-03-19
  Administered 2017-03-19: 1 via URETHRAL

## 2017-03-19 NOTE — Addendum Note (Signed)
Addended by: Tommy Rainwater on: 03/19/2017 04:28 PM   Modules accepted: Orders

## 2017-03-19 NOTE — Progress Notes (Signed)
03/19/2017 3:29 PM   Omar Khan [id number]  Referring provider: Marton Redwood, MD [address] [place], [place] [address]  Chief Complaint  Patient presents with  . Cysto    HPI: 83 year old male with history of low-grade TA bladder cancer diagnosed in February 2012.  At that time,  his tumor was 2 cm on the right lateral base. CT urogram at the time was otherwise negative.  He has been undergoing annual cystoscopy.  Last cystoscopy in 02/2016 was negative.  No episodes of gross hematuria over the past year.  Minimal voiding complaints today.. No urinary tract infections.  Not currently on any BPH medications.  His last prostate cancer screening was in April 2015 which was negative for any nodules.   PMH: Past Medical History:  Diagnosis Date  . Anxiety   . Atrial fibrillation ([place])   . Bell's palsy   . Depression   . Enlarged prostate with lower urinary tract symptoms (LUTS)   . GERD (gastroesophageal reflux disease)   . Glaucoma   . Gross hematuria   . Heart attack ([place])   . Hiatal hernia   . Hypertension   . Spinal stenosis     Surgical History: Past Surgical History:  Procedure Laterality Date  . BACK SURGERY    . CYSTOSCOPY WITH FULGERATION     medium lesion 2-5cm  . HEMORRHOID SURGERY    . TONSILLECTOMY    . TOTAL HIP ARTHROPLASTY      Home Medications:  Allergies as of 03/19/2017   No Known Allergies     Medication List       Accurate as of 03/19/17  3:29 PM. Always use your most recent med list.          ALPRAZolam 0.5 MG tablet Commonly known as:  XANAX Take 0.5 mg by mouth at bedtime as needed.   atorvastatin 20 MG tablet Commonly known as:  LIPITOR Take by mouth.   cloNIDine 0.1 MG tablet Commonly known as:  CATAPRES   diltiazem 240 MG 24 hr capsule Commonly known as:  CARDIZEM CD   ELIQUIS 2.5 MG Tabs tablet Generic drug:  apixaban Take by mouth.   fluticasone 50 MCG/ACT nasal spray Commonly known as:   FLONASE Place 2 sprays into the nose daily.   furosemide 40 MG tablet Commonly known as:  LASIX   LATANOPROST OP Apply to eye at bedtime.   NEXIUM 24HR 20 MG capsule Generic drug:  esomeprazole Take by mouth.   PARoxetine 20 MG tablet Commonly known as:  PAXIL Take 20 mg by mouth daily.       Allergies: No Known Allergies  Family History: Family History  Problem Relation Age of Onset  . Heart attack Father     Social History:  reports that he has quit smoking. He has never used smokeless tobacco. He reports that he does not drink alcohol or use drugs.  Physical Exam: BP (!) 147/63   Pulse 91   Ht 5\' 6"  (1.676 m)   Wt 182 lb (82.6 kg)   BMI 29.38 kg/m   Constitutional:  Alert and oriented, No acute distress. HEENT: [place] AT, moist mucus membranes.  Trachea midline, no masses. Cardiovascular: No clubbing, cyanosis, or edema. Respiratory: Normal respiratory effort, no increased work of breathing. GI: Abdomen is soft, nontender, nondistended, no abdominal masses GU: Circumcised phallus with orthotopic meatus. Bilateral testicles descended. Skin: No rashes, bruises or suspicious lesions. Neurologic: Grossly intact, no focal deficits, moving all 4 extremities.  Facial asymmetry noted. Psychiatric: Normal mood and affect.  Laboratory Data: Lab Results  Component Value Date   WBC 4.6 11/28/2013   HGB 13.4 11/28/2013   HCT 40.9 11/28/2013   MCV 95 11/28/2013   PLT 103 (L) 11/28/2013    Lab Results  Component Value Date   CREATININE 1.16 11/28/2013    Urinalysis N/a --> asymptomatic today   Cystoscopy Procedure Note  Patient identification was confirmed, informed consent was obtained, and patient was prepped using Betadine solution.  Lidocaine jelly was administered per urethral meatus.    Preoperative abx where received prior to procedure.     Pre-Procedure: - Inspection reveals a normal caliber ureteral meatus.  Procedure: The flexible cystoscope  was introduced without difficulty - No urethral strictures/lesions are present. - Enlarged prostate with trilobar coapation, 5 cm length - Elevated (mildy) bladder neck - Bilateral ureteral orifices identified - Bladder mucosa  reveals no ulcers, tumors, or lesions.  Stellate scar appreciated on right lateral wall bladder. - No bladder stones - No trabeculation  Retroflexion shows small intravesical protrusion of the median lobe   Post-Procedure: - Patient tolerated the procedure well   Assessment & Plan:    1. History of bladder cancer Cystoscopy negative today 5/18 negative- NED Low risk bladder cancer, single occurrence in 2012. We discussed and it shared decision-making manner whether not to continue to proceed with annual cystoscopy. He would like to continue with this if he is "still alive". Plan for repeat cystoscopy in one year. - Urinalysis, Complete - ciprofloxacin (CIPRO) tablet 500 mg; Take 1 tablet (500 mg total) by mouth once. - lidocaine (XYLOCAINE) 2 % jelly 1 application; Place 1 application into the urethra once.   Return in about 1 year (around 03/19/2018) for cysto.  Hollice Espy, MD  [facility] [address] [place], [place] [address] [phone]

## 2017-03-31 DIAGNOSIS — D0461 Carcinoma in situ of skin of right upper limb, including shoulder: Secondary | ICD-10-CM | POA: Diagnosis not present

## 2017-04-13 ENCOUNTER — Emergency Department
Admission: EM | Admit: 2017-04-13 | Discharge: 2017-04-13 | Disposition: A | Discharge status: Home / Self Care | Payer: PPO | Attending: Emergency Medicine | Admitting: Emergency Medicine

## 2017-04-13 ENCOUNTER — Emergency Department: Payer: PPO

## 2017-04-13 ENCOUNTER — Encounter: Payer: Self-pay | Admitting: Emergency Medicine

## 2017-04-13 DIAGNOSIS — I2510 Atherosclerotic heart disease of native coronary artery without angina pectoris: Secondary | ICD-10-CM | POA: Diagnosis present

## 2017-04-13 DIAGNOSIS — R42 Dizziness and giddiness: Secondary | ICD-10-CM

## 2017-04-13 DIAGNOSIS — R1110 Vomiting, unspecified: Secondary | ICD-10-CM | POA: Diagnosis not present

## 2017-04-13 DIAGNOSIS — I252 Old myocardial infarction: Secondary | ICD-10-CM | POA: Insufficient documentation

## 2017-04-13 DIAGNOSIS — I10 Essential (primary) hypertension: Secondary | ICD-10-CM | POA: Diagnosis not present

## 2017-04-13 DIAGNOSIS — Z87891 Personal history of nicotine dependence: Secondary | ICD-10-CM | POA: Diagnosis not present

## 2017-04-13 DIAGNOSIS — F419 Anxiety disorder, unspecified: Secondary | ICD-10-CM | POA: Diagnosis present

## 2017-04-13 DIAGNOSIS — Z79899 Other long term (current) drug therapy: Secondary | ICD-10-CM | POA: Insufficient documentation

## 2017-04-13 DIAGNOSIS — I4891 Unspecified atrial fibrillation: Secondary | ICD-10-CM | POA: Diagnosis not present

## 2017-04-13 DIAGNOSIS — E785 Hyperlipidemia, unspecified: Secondary | ICD-10-CM | POA: Diagnosis present

## 2017-04-13 LAB — BASIC METABOLIC PANEL
Anion gap: 11 (ref 5–15)
BUN: 15 mg/dL (ref 6–20)
CO2: 26 mmol/L (ref 22–32)
Calcium: 9 mg/dL (ref 8.9–10.3)
Chloride: 101 mmol/L (ref 101–111)
Creatinine, Ser: 1.11 mg/dL (ref 0.61–1.24)
GFR calc Af Amer: >60 mL/min (ref >60)
GFR, EST NON AFRICAN AMERICAN: 56 mL/min — AB (ref >60)
GLUCOSE: 127 mg/dL — AB (ref 65–99)
Potassium: 3.7 mmol/L (ref 3.5–5.1)
Sodium: 138 mmol/L (ref 135–145)

## 2017-04-13 LAB — URINALYSIS, COMPLETE (UACMP) WITH MICROSCOPIC
BACTERIA UA: NONE SEEN
Bilirubin Urine: NEGATIVE
GLUCOSE, UA: NEGATIVE mg/dL
HGB URINE DIPSTICK: NEGATIVE
Ketones, ur: NEGATIVE mg/dL
Leukocytes, UA: NEGATIVE
NITRITE: NEGATIVE
Protein, ur: NEGATIVE mg/dL
SPECIFIC GRAVITY, URINE: 1.006 (ref 1.005–1.030)
Squamous Epithelial / LPF: NONE SEEN
WBC, UA: NONE SEEN WBC/hpf (ref 0–5)
pH: 7 (ref 5.0–8.0)

## 2017-04-13 LAB — HEPATIC FUNCTION PANEL
ALT: 16 U/L — AB (ref 17–63)
AST: 32 U/L (ref 15–41)
Albumin: 4.2 g/dL (ref 3.5–5.0)
Alkaline Phosphatase: 90 U/L (ref 38–126)
BILIRUBIN DIRECT: 0.2 mg/dL (ref 0.1–0.5)
Indirect Bilirubin: 1.1 mg/dL — ABNORMAL HIGH (ref 0.3–0.9)
Total Bilirubin: 1.3 mg/dL — ABNORMAL HIGH (ref 0.3–1.2)
Total Protein: 7.8 g/dL (ref 6.5–8.1)

## 2017-04-13 LAB — CBC
HCT: 39.1 % — ABNORMAL LOW (ref 40.0–52.0)
Hemoglobin: 13.2 g/dL (ref 13.0–18.0)
MCH: 32.5 pg (ref 26.0–34.0)
MCHC: 33.8 g/dL (ref 32.0–36.0)
MCV: 96.4 fL (ref 80.0–100.0)
Platelets: 104 10*3/uL — ABNORMAL LOW (ref 150–440)
RBC: 4.06 MIL/uL — ABNORMAL LOW (ref 4.40–5.90)
RDW: 16.6 % — AB (ref 11.5–14.5)
WBC: 4.1 10*3/uL (ref 3.8–10.6)

## 2017-04-13 LAB — TROPONIN I

## 2017-04-13 MED ORDER — GADOBENATE DIMEGLUMINE 529 MG/ML IV SOLN
15.0000 mL | Freq: Once | INTRAVENOUS | Status: AC | PRN
  Administered 2017-04-13: 15 mL via INTRAVENOUS

## 2017-04-13 MED ORDER — GADOBENATE DIMEGLUMINE 529 MG/ML IV SOLN: 15.0000 mL | Freq: Once | INTRAVENOUS | Status: DC | PRN

## 2017-04-13 MED ORDER — SODIUM CHLORIDE 0.9 % IV BOLUS (SEPSIS)
500.0000 mL | Freq: Once | INTRAVENOUS | Status: AC
  Administered 2017-04-13: 500 mL via INTRAVENOUS

## 2017-04-13 MED ORDER — ASPIRIN 81 MG PO CHEW
324.0000 mg | CHEWABLE_TABLET | Freq: Once | ORAL | Status: AC
  Administered 2017-04-13: 324 mg via ORAL
  Filled 2017-04-13: qty 4

## 2017-04-13 NOTE — ED Provider Notes (Signed)
Assumed care from Dr. Zena Amos at 3 PM. Followed up on CT head which was negative, urinalysis negative. Plan from Dr. Burlene Arnt was to admit the patient. I discussed this with the hospitalist, who noted that since the patient was asymptomatic after just a brief episode of dizziness, he could follow-up outpatient if his MRI was negative for acute event. The MRI was performed and is unremarkable. Patient updated who is agreeable to outpatient follow-up. We'll discharge home, normal vital signs, good condition. I highly doubt a stroke or vertebrobasilar insufficiency or carotid dissection or vertebral dissection. No evidence of intracranial thrombosis or bleeding.   Carrie Mew, MD 04/13/17 [id number]

## 2017-04-13 NOTE — Consult Note (Signed)
[facility] NAME: Omar Khan    MR#:  [id number]  DATE OF BIRTH:  1925-03-20  DATE OF ADMISSION:  04/13/2017  PRIMARY CARE PHYSICIAN: Marton Redwood, MD   REQUESTING/REFERRING PHYSICIAN: Joni Fears, MD  CHIEF COMPLAINT:   Chief Complaint  Patient presents with  . Dizziness    HISTORY OF PRESENT ILLNESS:  Omar Khan  is a 83 y.o. male who presents with An episode of dizziness and vomiting. Patient states he was at his desk this afternoon when he had an episode of acute dizziness and then nausea with vomiting 1. His symptoms then subsided. He came to the ED for evaluation. He states that he has no prior history of stroke, he does have some left-sided facial abnormality which he states he's had for greater than 20 years and was told it was due to Bell's palsy. He has no new abnormality at this time, and his symptoms have completely resolved. Hospitalists were called for evaluation for potential further workup for concern of stroke.  PAST MEDICAL HISTORY:   Past Medical History:  Diagnosis Date  . Anxiety   . Atrial fibrillation ([place])   . Bell's palsy   . Depression   . Enlarged prostate with lower urinary tract symptoms (LUTS)   . GERD (gastroesophageal reflux disease)   . Glaucoma   . Gross hematuria   . Heart attack ([place])   . Hiatal hernia   . Hypertension   . Spinal stenosis     PAST SURGICAL HISTOIRY:   Past Surgical History:  Procedure Laterality Date  . BACK SURGERY    . CYSTOSCOPY WITH FULGERATION     medium lesion 2-5cm  . HEMORRHOID SURGERY    . TONSILLECTOMY    . TOTAL HIP ARTHROPLASTY      SOCIAL HISTORY:   Social History  Substance Use Topics  . Smoking status: Former Research scientist (life sciences)  . Smokeless tobacco: Never Used  . Alcohol use No    FAMILY HISTORY:   Family History  Problem Relation Age of Onset  . Heart attack Father     DRUG ALLERGIES:  No Known Allergies  REVIEW OF SYSTEMS:   Review of Systems  Constitutional: Negative for chills, fever, malaise/fatigue and weight loss.  HENT: Negative for ear pain, hearing loss and tinnitus.   Eyes: Negative for blurred vision, double vision, pain and redness.  Respiratory: Negative for cough, hemoptysis and shortness of breath.   Cardiovascular: Negative for chest pain, palpitations, orthopnea and leg swelling.  Gastrointestinal: Positive for nausea and vomiting. Negative for abdominal pain, constipation and diarrhea.  Genitourinary: Negative for dysuria, frequency and hematuria.  Musculoskeletal: Negative for back pain, joint pain and neck pain.  Skin:       No acne, rash, or lesions  Neurological: Positive for dizziness. Negative for tremors, focal weakness and weakness.  Endo/Heme/Allergies: Negative for polydipsia. Does not bruise/bleed easily.  Psychiatric/Behavioral: Negative for depression. The patient is not nervous/anxious and does not have insomnia.     MEDICATIONS AT HOME:   Prior to Admission medications   Medication Sig Start Date End Date Taking? Authorizing Provider  ALPRAZolam Duanne Moron) 0.5 MG tablet Take 0.5 mg by mouth at bedtime as needed. 02/26/16  Yes [provider]  cloNIDine (CATAPRES) 0.1 MG tablet Take 0.1 mg by mouth 2 (two) times daily.  02/15/16  Yes [provider]  diltiazem (CARDIZEM CD) 240 MG 24 hr capsule Take 240 mg by mouth every evening.  11/20/15  Yes [provider]  fluticasone (FLONASE) 50 MCG/ACT nasal spray Place 2 sprays into the nose daily.   Yes [provider]  furosemide (LASIX) 40 MG tablet 40 mg daily.  02/15/16  Yes [provider]  latanoprost (XALATAN) 0.005 % ophthalmic solution Apply 1 drop to eye at bedtime.    Yes [provider]  PARoxetine (PAXIL) 20 MG tablet Take 20 mg by mouth daily.   Yes [provider]      VITAL SIGNS:   Vitals:   04/13/17 1615 04/13/17 1630 04/13/17 1645 04/13/17 1700  BP:   128/75  137/63  Pulse: 78  69 74  Resp: 18 17 17 16   Temp:      TempSrc:      SpO2: 95%  93% 95%   Wt Readings from Last 3 Encounters:  03/19/17 82.6 kg (182 lb)  03/15/16 79.4 kg (175 lb)  12/17/12 87.1 kg (192 lb)    PHYSICAL EXAMINATION:  Physical Exam  Vitals reviewed. Constitutional: He is oriented to person, place, and time. He appears well-developed and well-nourished. No distress.  HENT:  Head: Normocephalic and atraumatic.  Mouth/Throat: Oropharynx is clear and moist.  Eyes: Conjunctivae and EOM are normal. Pupils are equal, round, and reactive to light. No scleral icterus.  Neck: Normal range of motion. Neck supple. No JVD present. No thyromegaly present.  Cardiovascular: Normal rate, regular rhythm and intact distal pulses.  Exam reveals no gallop and no friction rub.   No murmur heard. Respiratory: Effort normal and breath sounds normal. No respiratory distress. He has no wheezes. He has no rales.  GI: Soft. Bowel sounds are normal. He exhibits no distension. There is no tenderness.  Musculoskeletal: Normal range of motion. He exhibits no edema.  No arthritis, no gout  Lymphadenopathy:    He has no cervical adenopathy.  Neurological: He is alert and oriented to person, place, and time. No cranial nerve deficit.  Stable left lower facial weakness, no other acute focal neurologic deficit  Skin: Skin is warm and dry. No rash noted. No erythema.  Psychiatric: He has a normal mood and affect. His behavior is normal. Judgment and thought content normal.     LABORATORY PANEL:   CBC  Recent Labs Lab 04/13/17 1412  WBC 4.1  HGB 13.2  HCT 39.1*  PLT 104*   ------------------------------------------------------------------------------------------------------------------  Chemistries   Recent Labs Lab 04/13/17 1412  NA 138  K 3.7  CL 101  CO2 26  GLUCOSE 127*  BUN 15  CREATININE 1.11  CALCIUM 9.0  AST 32  ALT 16*  ALKPHOS 90  BILITOT 1.3*    ------------------------------------------------------------------------------------------------------------------  Cardiac Enzymes  Recent Labs Lab 04/13/17 1412  TROPONINI <0.03   ------------------------------------------------------------------------------------------------------------------  RADIOLOGY:  Dg Chest 2 View  Result Date: 04/13/2017 CLINICAL DATA:  Pt brought in by [facility] from home for dizziness. Pt report sudden on set dizziness when on his computer today. Pt also states that he had nausea and 1 episode of vomiting. Pt denies any symptoms at this time. Hx - MI, a-fib, HTN, former smoker. EXAM: CHEST  2 VIEW COMPARISON:  07/02/2008 FINDINGS: Normal mediastinum. Aorta is ectatic. Normal pulmonary vasculature. No evidence of effusion, infiltrate, or pneumothorax. No acute bony abnormality. IMPRESSION: No acute cardiopulmonary process. Electronically Signed   By: Suzy Bouchard M.D.   On: 04/13/2017 15:00   Ct Head Wo Contrast  Result Date: 04/13/2017 CLINICAL DATA:  Pt brought in by [facility] from home for dizziness.  Pt report sudden on set dizziness when on his computer today. Pt also states that he had nausea and 1 episode of vomiting. Pt denies any symptoms at this time. EXAM: CT HEAD WITHOUT CONTRAST TECHNIQUE: Contiguous axial images were obtained from the base of the skull through the vertex without intravenous contrast. COMPARISON:  None. FINDINGS: Brain: No acute intracranial hemorrhage. No focal mass lesion. No CT evidence of acute infarction. No midline shift or mass effect. No hydrocephalus. Basilar cisterns are patent. Generalized cortical atrophy. Mild periventricular white matter hypodensities. Vascular: No hyperdense vessel or unexpected calcification. Skull: Normal. Negative for fracture or focal lesion. Sinuses/Orbits: Paranasal sinuses and mastoid air cells are clear. Orbits are clear. Other: None. IMPRESSION: 1. No acute intracranial findings. 2. Atrophy and white  matter microvascular disease. Electronically Signed   By: Suzy Bouchard M.D.   On: 04/13/2017 15:03   Mr Jeri Cos KZ Contrast  Result Date: 04/13/2017 CLINICAL DATA:  Severe dizziness or vertigo which began earlier today. No loss of consciousness. EXAM: MRI HEAD WITHOUT AND WITH CONTRAST TECHNIQUE: Multiplanar, multiecho pulse sequences of the brain and surrounding structures were obtained without and with intravenous contrast. CONTRAST:  66mL MULTIHANCE GADOBENATE DIMEGLUMINE 529 MG/ML IV SOLN COMPARISON:  CT head earlier today. FINDINGS: Brain: No evidence for acute infarction, hemorrhage, mass lesion, hydrocephalus, or extra-axial fluid. Moderately severe cerebral and cerebellar atrophy, not unexpected for age. Prominence of the extracerebral CSF spaces are favored to represent loss of brain substance rather than hygromas. Moderate subcortical and periventricular T2 and FLAIR hyperintensities, likely chronic microvascular ischemic change. Post infusion, no abnormal enhancement of the brain or meninges. Vascular: Flow voids are maintained throughout the carotid, basilar, and vertebral arteries. There are no areas of chronic hemorrhage. Skull and upper cervical spine: Normal marrow signal. No tonsillar herniation. Mild pannus surrounds the odontoid, noncompressive. Midline Thornwaldt cyst, roughly 1 cm diameter. Sinuses/Orbits: Mild chronic sinus disease. No layering fluid or significant opacity. BILATERAL cataract extraction. Other: No mastoid fluid. Scalp and other visualized extracranial soft tissues grossly unremarkable. IMPRESSION: Atrophy and small vessel disease. No acute intracranial findings. No abnormal postcontrast enhancement. Incidental midline nasopharyngeal Thornwaldt cyst. No eustachian tube dysfunction. Electronically Signed   By: Staci Righter M.D.   On: 04/13/2017 20:00    EKG:   Orders placed or performed during the hospital encounter of 04/13/17  . ED EKG  . ED EKG  . EKG 12-Lead   . EKG 12-Lead    IMPRESSION AND PLAN:  Principal Problem:   Dizziness - Patient has a known history of A. fib, he is on anticoagulation, there was concern given his episode today for possible stroke. Stat MRI was recommended and ordered in the ED, and does not show any acute stroke. We recommend that the patient can be discharged at this time, and can follow up with his outpatient primary care physician and can be referred to neurology outpatient if he has recurrence of symptoms or so desires.  All the records are reviewed and case discussed with ED provider. Management plans discussed with the patient and/or family.   TOTAL TIME TAKING CARE OF THIS PATIENT: 40 minutes.    Omar Khan 04/13/2017, 8:11 PM  [facility]  Office  [phone]  CC: Primary care Physician: Marton Redwood, MD  Note:  This document was prepared using Dragon voice recognition software and may include unintentional dictation errors.

## 2017-04-13 NOTE — ED Triage Notes (Signed)
Pt brought in by [facility] from home for dizziness. Pt report sudden on set dizziness when on his computer today. Pt also states that he had nausea and 1 episode of vomiting. Pt denies any symptoms at this time.

## 2017-04-13 NOTE — ED Provider Notes (Signed)
[facility] Emergency Department Provider Note  ____________________________________________   I have reviewed the triage vital signs and the nursing notes.   HISTORY  Chief Complaint Dizziness    HPI Omar Khan is a 83 y.o. male who presents today complaining of severe "dizziness" which seems like he may have a true vertigo which lasted for a few minutes today may have feeling might pass out. Happen while he was sitting at the computer. He did not pass out. No chest pain or shortness of breath did vomit once, nonbloody nonbilious, he has had no recent GI bleed symptoms no pain of any variety needs return to his baseline. He states it was quite severe. His never had this before. He is unsure what made it better, nothing significantly worse. Happen at rest. Of note, patient does have a known Bell's palsy. Has been there for 15 years he states. Sugar was normal for EMS. He should also states he did hit his head 2 days ago with no significant injury. He rolled out of bed. He is on blood thinners.    Past Medical History:  Diagnosis Date  . Anxiety   . Atrial fibrillation ([place])   . Bell's palsy   . Depression   . Enlarged prostate with lower urinary tract symptoms (LUTS)   . GERD (gastroesophageal reflux disease)   . Glaucoma   . Gross hematuria   . Heart attack ([place])   . Hiatal hernia   . Hypertension   . Spinal stenosis     Patient Active Problem List   Diagnosis Date Noted  . Anxiety 03/15/2016  . Atrial fibrillation ([place]) 03/15/2016  . Cerebrovascular accident (CVA) ([place]) 03/15/2016  . HLD (hyperlipidemia) 03/15/2016  . BP (high blood pressure) 03/15/2016  . Myocardial infarction ([place]) 03/15/2016  . HYPERLIPIDEMIA 02/19/2008  . ANXIETY 02/19/2008  . CORONARY ARTERY DISEASE 02/19/2008  . HEMORRHOIDS, INTERNAL 02/19/2008  . EXTERNAL HEMORRHOIDS 02/19/2008  . DIVERTICULOSIS OF COLON 02/19/2008  . COLONIC POLYPS, HX OF 02/19/2008    Past  Surgical History:  Procedure Laterality Date  . BACK SURGERY    . CYSTOSCOPY WITH FULGERATION     medium lesion 2-5cm  . HEMORRHOID SURGERY    . TONSILLECTOMY    . TOTAL HIP ARTHROPLASTY      Prior to Admission medications   Medication Sig Start Date End Date Taking? Authorizing Provider  ALPRAZolam Duanne Moron) 0.5 MG tablet Take 0.5 mg by mouth at bedtime as needed. 02/26/16  Yes [provider]  cloNIDine (CATAPRES) 0.1 MG tablet Take 0.1 mg by mouth 2 (two) times daily.  02/15/16  Yes [provider]  diltiazem (CARDIZEM CD) 240 MG 24 hr capsule Take 240 mg by mouth every evening.  11/20/15  Yes [provider]  fluticasone (FLONASE) 50 MCG/ACT nasal spray Place 2 sprays into the nose daily.   Yes [provider]  furosemide (LASIX) 40 MG tablet 40 mg daily.  02/15/16  Yes [provider]  latanoprost (XALATAN) 0.005 % ophthalmic solution Apply 1 drop to eye at bedtime.    Yes [provider]  PARoxetine (PAXIL) 20 MG tablet Take 20 mg by mouth daily.   Yes [provider]    Allergies Patient has no known allergies.  Family History  Problem Relation Age of Onset  . Heart attack Father     Social History Social History  Substance Use Topics  . Smoking status: Former Research scientist (life sciences)  . Smokeless tobacco: Never Used  .  Alcohol use No    Review of Systems Constitutional: No fever/chills Eyes: No visual changes. ENT: No sore throat. No stiff neck no neck pain Cardiovascular: Denies chest pain. Respiratory: Denies shortness of breath. Gastrointestinal:   no vomiting.  No diarrhea.  No constipation. Genitourinary: Negative for dysuria. Musculoskeletal: Negative lower extremity swelling Skin: Negative for rash. Neurological: Negative for severe headaches, focal weakness or numbness.   ____________________________________________   PHYSICAL EXAM:  VITAL SIGNS: ED Triage Vitals [04/13/17 1408]  Enc Vitals Group     BP  139/73     Pulse Rate 82     Resp 15     Temp 97.7 F (36.5 C)     Temp Source Oral     SpO2 93 %     Weight      Height      Head Circumference      Peak Flow      Pain Score      Pain Loc      Pain Edu?      Excl. in [place]?     Constitutional: Alert and oriented. Well appearing and in no acute distress. Eyes: Conjunctivae are normal Head: Healing scratch to the right side of the forehead from prior fall HEENT: No congestion/rhinnorhea. Mucous membranes are moist.  Oropharynx non-erythematous Neck:   Nontender with no meningismus, no masses, no stridor Cardiovascular: Normal rate, regular rhythm. Grossly normal heart sounds.  Good peripheral circulation. Respiratory: Normal respiratory effort.  No retractions. Lungs CTAB. Abdominal: Soft and nontender. No distention. No guarding no rebound Back:  There is no focal tenderness or step off.  there is no midline tenderness there are no lesions noted. there is no CVA tenderness Musculoskeletal: No lower extremity tenderness, no upper extremity tenderness. No joint effusions, no DVT signs strong distal pulses no edema Neurologic:  Normal speech and language. No gross focal neurologic deficits are appreciated, aside from baseline Bell's palsy on the right for this patient Skin:  Skin is warm, dry and intact. No rash noted. Psychiatric: Mood and affect are normal. Speech and behavior are normal.  ____________________________________________   LABS (all labs ordered are listed, but only abnormal results are displayed)  Labs Reviewed  BASIC METABOLIC PANEL - Abnormal; Notable for the following:       Result Value   Glucose, Bld 127 (*)    GFR calc non Af Amer 56 (*)    All other components within normal limits  CBC - Abnormal; Notable for the following:    RBC 4.06 (*)    HCT 39.1 (*)    RDW 16.6 (*)    Platelets 104 (*)    All other components within normal limits  HEPATIC FUNCTION PANEL - Abnormal; Notable for the following:     ALT 16 (*)    Total Bilirubin 1.3 (*)    Indirect Bilirubin 1.1 (*)    All other components within normal limits  TROPONIN I  URINALYSIS, COMPLETE (UACMP) WITH MICROSCOPIC  CBG MONITORING, ED   ____________________________________________  EKG  I personally interpreted any EKGs ordered by me or triage Atrial fib with a bundle branch block, rate 89 ____________________________________________  RADIOLOGY  I reviewed any imaging ordered by me or triage that were performed during my shift and, if possible, patient and/or family made aware of any abnormal findings. ____________________________________________   PROCEDURES  Procedure(s) performed: None  Procedures  Critical Care performed: None  ____________________________________________   INITIAL IMPRESSION / ASSESSMENT AND PLAN / ED  COURSE  Pertinent labs & imaging results that were available during my care of the patient were reviewed by me and considered in my medical decision making (see chart for details).  Patient was sudden onset severe dizziness which sounds again may be to some extent vertiginous symptoms which rapidly resolved but was writing to the patient. Has not had this before. Differential includes TIA hour thank or vascular insufficiency to the brain. Workup is otherwise reassuring at this time. No evidence of injury from recent trauma aside from from a scratch to his head. No evidence of infection or bleed to the brain. Workup is otherwise unremarkable. However this is a very unusual event for this patient and he was vomiting and presyncopal. He may benefit from neurological assessment and patient. We will discuss this further with the patient. He is asymptomatic at this time.    ____________________________________________   FINAL CLINICAL IMPRESSION(S) / ED DIAGNOSES  Final diagnoses:  None      This chart was dictated using voice recognition software.  Despite best efforts to proofread,  errors  can occur which can change meaning.      Schuyler Amor, MD 04/13/17 [phone]

## 2017-04-13 NOTE — Discharge Instructions (Signed)
Your tests, including MRI of the brain, were unremarkable today.  Follow up with your doctor for continued monitoring of your symptoms.  Results for orders placed or performed during the hospital encounter of 81/01/75  Basic metabolic panel  Result Value Ref Range   Sodium 138 135 - 145 mmol/L   Potassium 3.7 3.5 - 5.1 mmol/L   Chloride 101 101 - 111 mmol/L   CO2 26 22 - 32 mmol/L   Glucose, Bld 127 (H) 65 - 99 mg/dL   BUN 15 6 - 20 mg/dL   Creatinine, Ser 1.11 0.61 - 1.24 mg/dL   Calcium 9.0 8.9 - 10.3 mg/dL   GFR calc non Af Amer 56 (L) >60 mL/min   GFR calc Af Amer >60 >60 mL/min   Anion gap 11 5 - 15  CBC  Result Value Ref Range   WBC 4.1 3.8 - 10.6 K/uL   RBC 4.06 (L) 4.40 - 5.90 MIL/uL   Hemoglobin 13.2 13.0 - 18.0 g/dL   HCT 39.1 (L) 40.0 - 52.0 %   MCV 96.4 80.0 - 100.0 fL   MCH 32.5 26.0 - 34.0 pg   MCHC 33.8 32.0 - 36.0 g/dL   RDW 16.6 (H) 11.5 - 14.5 %   Platelets 104 (L) 150 - 440 K/uL  Urinalysis, Complete w Microscopic  Result Value Ref Range   Color, Urine STRAW (A) YELLOW   APPearance CLEAR (A) CLEAR   Specific Gravity, Urine 1.006 1.005 - 1.030   pH 7.0 5.0 - 8.0   Glucose, UA NEGATIVE NEGATIVE mg/dL   Hgb urine dipstick NEGATIVE NEGATIVE   Bilirubin Urine NEGATIVE NEGATIVE   Ketones, ur NEGATIVE NEGATIVE mg/dL   Protein, ur NEGATIVE NEGATIVE mg/dL   Nitrite NEGATIVE NEGATIVE   Leukocytes, UA NEGATIVE NEGATIVE   RBC / HPF 0-5 0 - 5 RBC/hpf   WBC, UA NONE SEEN 0 - 5 WBC/hpf   Bacteria, UA NONE SEEN NONE SEEN   Squamous Epithelial / LPF NONE SEEN NONE SEEN   Hyaline Casts, UA PRESENT   Troponin I  Result Value Ref Range   Troponin I <0.03 <0.03 ng/mL  Hepatic function panel  Result Value Ref Range   Total Protein 7.8 6.5 - 8.1 g/dL   Albumin 4.2 3.5 - 5.0 g/dL   AST 32 15 - 41 U/L   ALT 16 (L) 17 - 63 U/L   Alkaline Phosphatase 90 38 - 126 U/L   Total Bilirubin 1.3 (H) 0.3 - 1.2 mg/dL   Bilirubin, Direct 0.2 0.1 - 0.5 mg/dL   Indirect Bilirubin  1.1 (H) 0.3 - 0.9 mg/dL   Dg Chest 2 View  Result Date: 04/13/2017 CLINICAL DATA:  Pt brought in by [facility] from home for dizziness. Pt report sudden on set dizziness when on his computer today. Pt also states that he had nausea and 1 episode of vomiting. Pt denies any symptoms at this time. Hx - MI, a-fib, HTN, former smoker. EXAM: CHEST  2 VIEW COMPARISON:  07/02/2008 FINDINGS: Normal mediastinum. Aorta is ectatic. Normal pulmonary vasculature. No evidence of effusion, infiltrate, or pneumothorax. No acute bony abnormality. IMPRESSION: No acute cardiopulmonary process. Electronically Signed   By: Suzy Bouchard M.D.   On: 04/13/2017 15:00   Ct Head Wo Contrast  Result Date: 04/13/2017 CLINICAL DATA:  Pt brought in by [facility] from home for dizziness. Pt report sudden on set dizziness when on his computer today. Pt also states that he had nausea and 1 episode of vomiting. Pt  denies any symptoms at this time. EXAM: CT HEAD WITHOUT CONTRAST TECHNIQUE: Contiguous axial images were obtained from the base of the skull through the vertex without intravenous contrast. COMPARISON:  None. FINDINGS: Brain: No acute intracranial hemorrhage. No focal mass lesion. No CT evidence of acute infarction. No midline shift or mass effect. No hydrocephalus. Basilar cisterns are patent. Generalized cortical atrophy. Mild periventricular white matter hypodensities. Vascular: No hyperdense vessel or unexpected calcification. Skull: Normal. Negative for fracture or focal lesion. Sinuses/Orbits: Paranasal sinuses and mastoid air cells are clear. Orbits are clear. Other: None. IMPRESSION: 1. No acute intracranial findings. 2. Atrophy and white matter microvascular disease. Electronically Signed   By: Suzy Bouchard M.D.   On: 04/13/2017 15:03   Mr Jeri Cos HU Contrast  Result Date: 04/13/2017 CLINICAL DATA:  Severe dizziness or vertigo which began earlier today. No loss of consciousness. EXAM: MRI HEAD WITHOUT AND WITH CONTRAST  TECHNIQUE: Multiplanar, multiecho pulse sequences of the brain and surrounding structures were obtained without and with intravenous contrast. CONTRAST:  73mL MULTIHANCE GADOBENATE DIMEGLUMINE 529 MG/ML IV SOLN COMPARISON:  CT head earlier today. FINDINGS: Brain: No evidence for acute infarction, hemorrhage, mass lesion, hydrocephalus, or extra-axial fluid. Moderately severe cerebral and cerebellar atrophy, not unexpected for age. Prominence of the extracerebral CSF spaces are favored to represent loss of brain substance rather than hygromas. Moderate subcortical and periventricular T2 and FLAIR hyperintensities, likely chronic microvascular ischemic change. Post infusion, no abnormal enhancement of the brain or meninges. Vascular: Flow voids are maintained throughout the carotid, basilar, and vertebral arteries. There are no areas of chronic hemorrhage. Skull and upper cervical spine: Normal marrow signal. No tonsillar herniation. Mild pannus surrounds the odontoid, noncompressive. Midline Thornwaldt cyst, roughly 1 cm diameter. Sinuses/Orbits: Mild chronic sinus disease. No layering fluid or significant opacity. BILATERAL cataract extraction. Other: No mastoid fluid. Scalp and other visualized extracranial soft tissues grossly unremarkable. IMPRESSION: Atrophy and small vessel disease. No acute intracranial findings. No abnormal postcontrast enhancement. Incidental midline nasopharyngeal Thornwaldt cyst. No eustachian tube dysfunction. Electronically Signed   By: Staci Righter M.D.   On: 04/13/2017 20:00

## 2017-04-22 DIAGNOSIS — E782 Mixed hyperlipidemia: Secondary | ICD-10-CM | POA: Diagnosis not present

## 2017-04-22 DIAGNOSIS — F419 Anxiety disorder, unspecified: Secondary | ICD-10-CM | POA: Diagnosis not present

## 2017-04-22 DIAGNOSIS — R011 Cardiac murmur, unspecified: Secondary | ICD-10-CM | POA: Diagnosis not present

## 2017-04-22 DIAGNOSIS — I480 Paroxysmal atrial fibrillation: Secondary | ICD-10-CM | POA: Diagnosis not present

## 2017-04-22 DIAGNOSIS — R42 Dizziness and giddiness: Secondary | ICD-10-CM | POA: Diagnosis not present

## 2017-04-22 DIAGNOSIS — I10 Essential (primary) hypertension: Secondary | ICD-10-CM | POA: Diagnosis not present

## 2017-04-22 DIAGNOSIS — I639 Cerebral infarction, unspecified: Secondary | ICD-10-CM | POA: Diagnosis not present

## 2017-05-26 DIAGNOSIS — H401131 Primary open-angle glaucoma, bilateral, mild stage: Secondary | ICD-10-CM | POA: Diagnosis not present

## 2017-06-09 DIAGNOSIS — E784 Other hyperlipidemia: Secondary | ICD-10-CM | POA: Diagnosis not present

## 2017-06-09 DIAGNOSIS — I10 Essential (primary) hypertension: Secondary | ICD-10-CM | POA: Diagnosis not present

## 2017-06-09 DIAGNOSIS — R7301 Impaired fasting glucose: Secondary | ICD-10-CM | POA: Diagnosis not present

## 2017-06-16 DIAGNOSIS — Z6829 Body mass index (BMI) 29.0-29.9, adult: Secondary | ICD-10-CM | POA: Diagnosis not present

## 2017-06-16 DIAGNOSIS — Z7901 Long term (current) use of anticoagulants: Secondary | ICD-10-CM | POA: Diagnosis not present

## 2017-06-16 DIAGNOSIS — I480 Paroxysmal atrial fibrillation: Secondary | ICD-10-CM | POA: Diagnosis not present

## 2017-06-16 DIAGNOSIS — E784 Other hyperlipidemia: Secondary | ICD-10-CM | POA: Diagnosis not present

## 2017-06-16 DIAGNOSIS — Z23 Encounter for immunization: Secondary | ICD-10-CM | POA: Diagnosis not present

## 2017-06-16 DIAGNOSIS — I2510 Atherosclerotic heart disease of native coronary artery without angina pectoris: Secondary | ICD-10-CM | POA: Diagnosis not present

## 2017-06-16 DIAGNOSIS — Z0000 Encounter for general adult medical examination without abnormal findings: Secondary | ICD-10-CM | POA: Diagnosis not present

## 2017-06-16 DIAGNOSIS — I10 Essential (primary) hypertension: Secondary | ICD-10-CM | POA: Diagnosis not present

## 2017-06-16 DIAGNOSIS — R7301 Impaired fasting glucose: Secondary | ICD-10-CM | POA: Diagnosis not present

## 2017-06-16 DIAGNOSIS — Z1389 Encounter for screening for other disorder: Secondary | ICD-10-CM | POA: Diagnosis not present

## 2017-06-16 DIAGNOSIS — I5022 Chronic systolic (congestive) heart failure: Secondary | ICD-10-CM | POA: Diagnosis not present

## 2017-06-16 DIAGNOSIS — E781 Pure hyperglyceridemia: Secondary | ICD-10-CM | POA: Diagnosis not present

## 2017-07-08 DIAGNOSIS — L400 Psoriasis vulgaris: Secondary | ICD-10-CM | POA: Diagnosis not present

## 2017-07-08 DIAGNOSIS — L570 Actinic keratosis: Secondary | ICD-10-CM | POA: Diagnosis not present

## 2017-07-08 DIAGNOSIS — X32XXXA Exposure to sunlight, initial encounter: Secondary | ICD-10-CM | POA: Diagnosis not present

## 2017-07-08 DIAGNOSIS — Z85828 Personal history of other malignant neoplasm of skin: Secondary | ICD-10-CM | POA: Diagnosis not present

## 2017-07-08 DIAGNOSIS — D225 Melanocytic nevi of trunk: Secondary | ICD-10-CM | POA: Diagnosis not present

## 2017-07-08 DIAGNOSIS — B372 Candidiasis of skin and nail: Secondary | ICD-10-CM | POA: Diagnosis not present

## 2017-07-08 IMAGING — CR DG CHEST 2V
2 series · 2 of 2 positions shown · non-contrast
Comparison: 07/02/2008

CLINICAL DATA: Pt brought in by [facility] from home for dizziness. Pt
report sudden on set dizziness when on his computer today. Pt also
states that he had nausea and 1 episode of vomiting. Pt denies any
symptoms at this time. Hx - MI, a-fib, HTN, former smoker.

EXAM:
CHEST  2 VIEW

[chest lat]
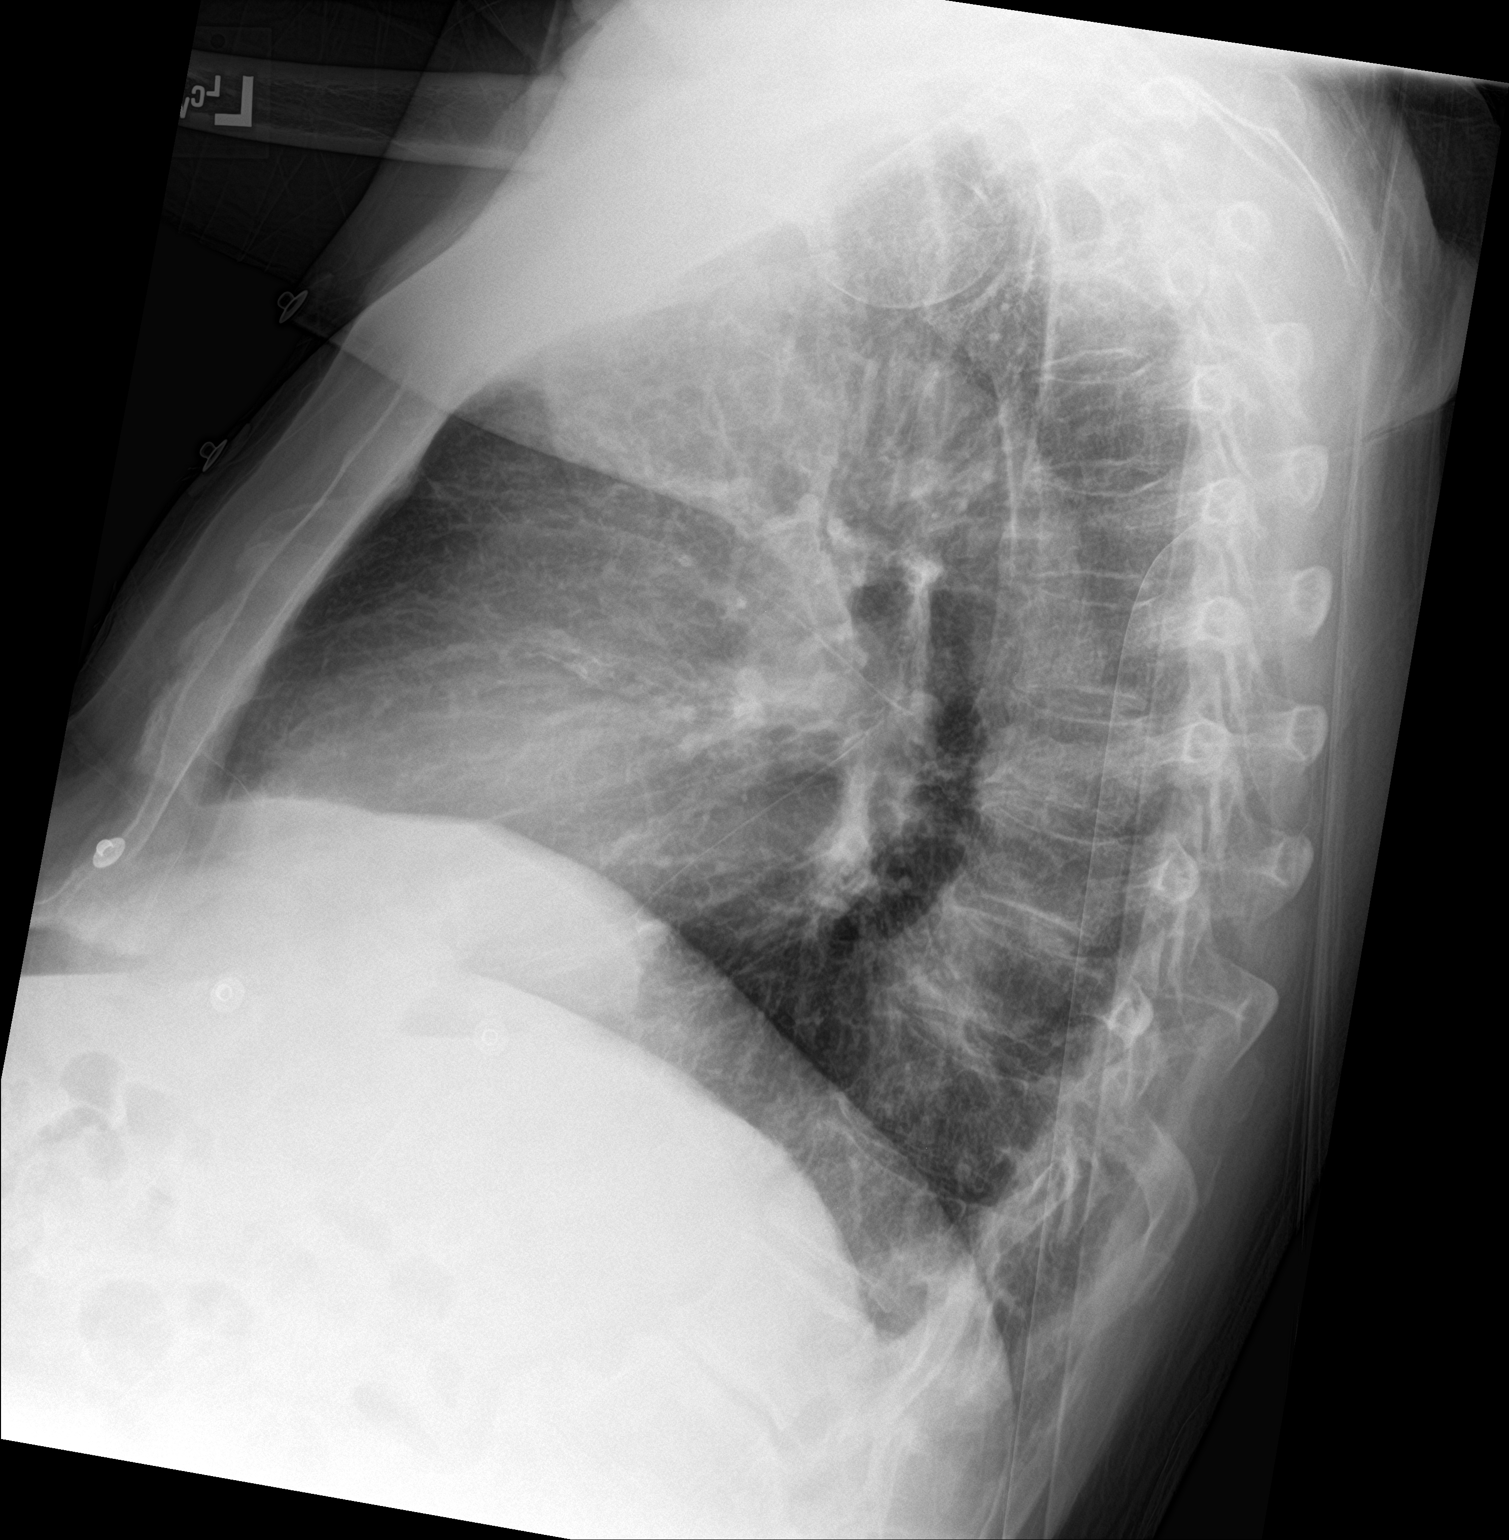

[chest ap]
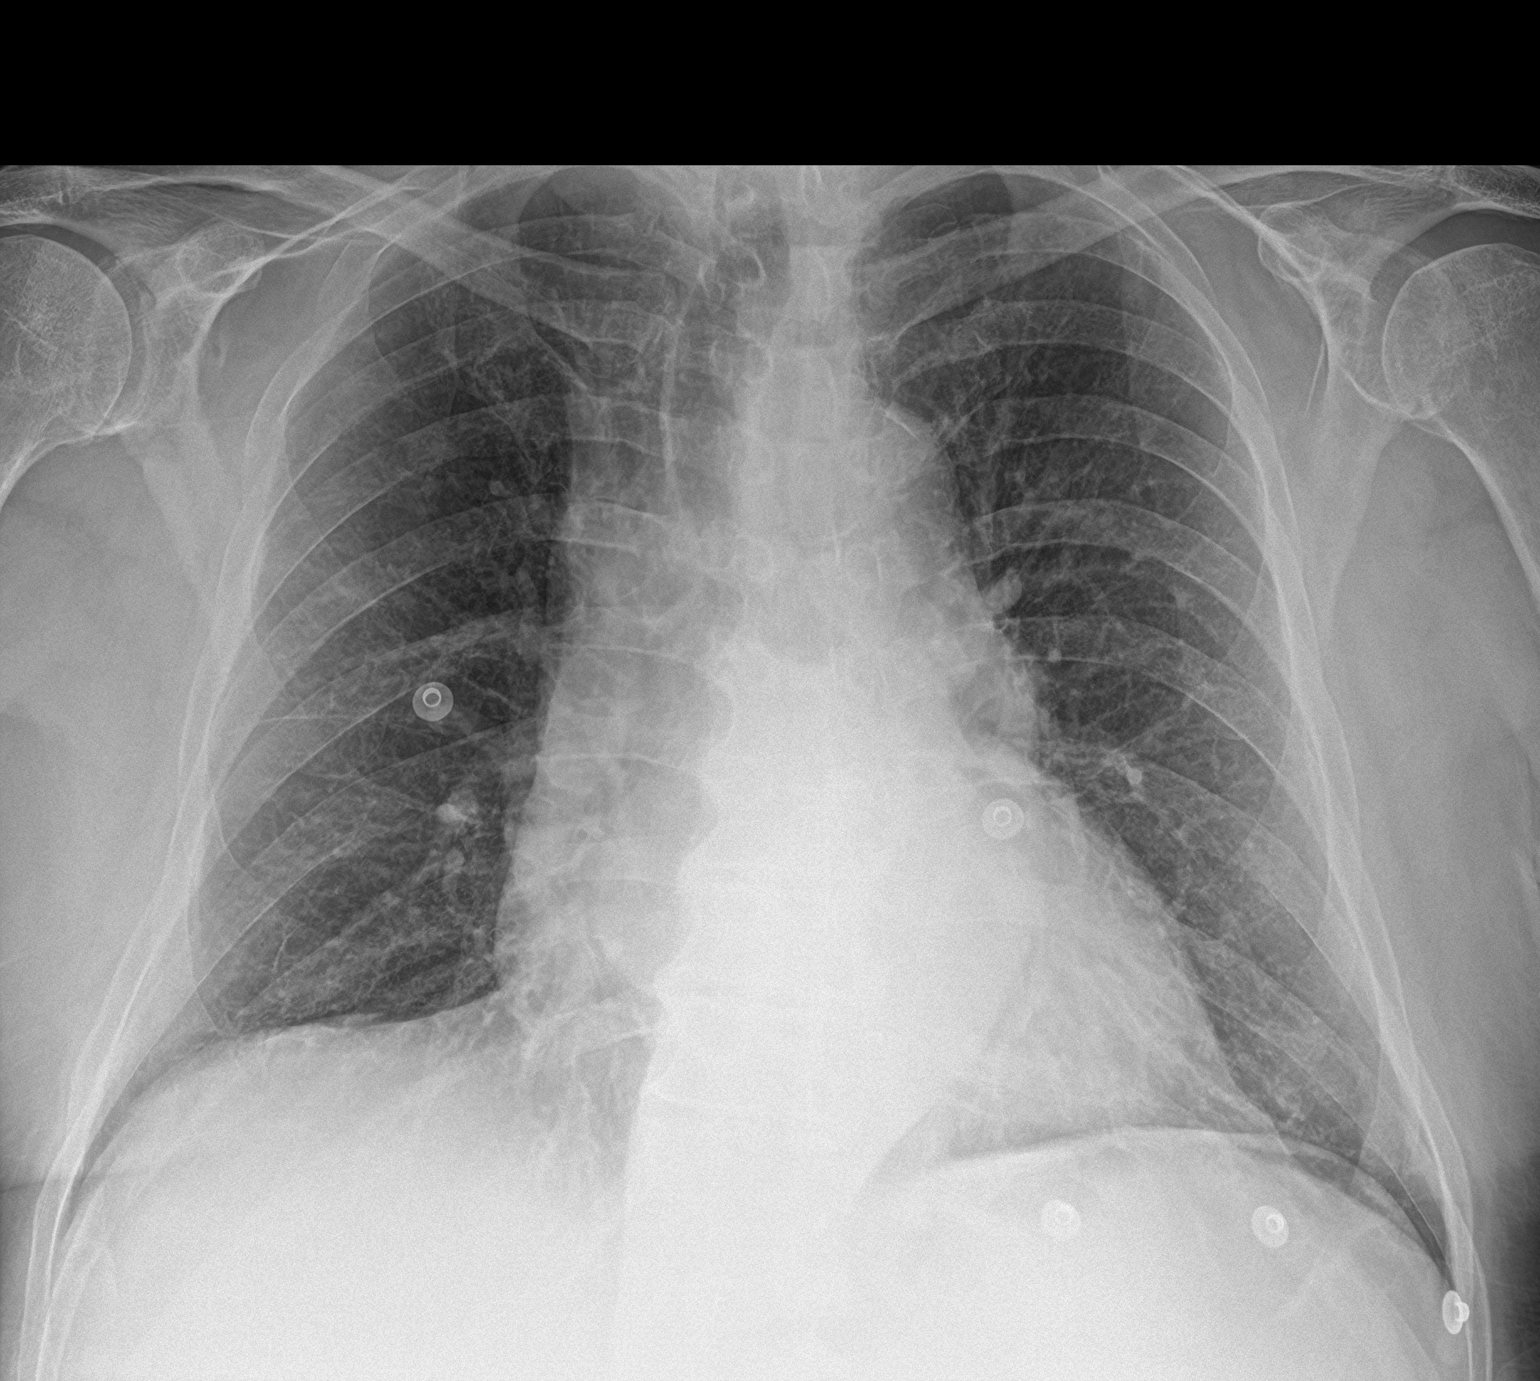

[2 of 2 positions shown; findings below may reference images not displayed]

FINDINGS: Normal mediastinum. Aorta is ectatic. Normal pulmonary vasculature.
No evidence of effusion, infiltrate, or pneumothorax. No acute bony
abnormality.
IMPRESSION: No acute cardiopulmonary process.

## 2017-11-25 DIAGNOSIS — H401131 Primary open-angle glaucoma, bilateral, mild stage: Secondary | ICD-10-CM | POA: Diagnosis not present

## 2017-12-01 DIAGNOSIS — H401131 Primary open-angle glaucoma, bilateral, mild stage: Secondary | ICD-10-CM | POA: Diagnosis not present

## 2017-12-01 DIAGNOSIS — R208 Other disturbances of skin sensation: Secondary | ICD-10-CM | POA: Diagnosis not present

## 2017-12-01 DIAGNOSIS — C44729 Squamous cell carcinoma of skin of left lower limb, including hip: Secondary | ICD-10-CM | POA: Diagnosis not present

## 2017-12-01 DIAGNOSIS — D485 Neoplasm of uncertain behavior of skin: Secondary | ICD-10-CM | POA: Diagnosis not present

## 2017-12-01 DIAGNOSIS — L281 Prurigo nodularis: Secondary | ICD-10-CM | POA: Diagnosis not present

## 2017-12-10 DIAGNOSIS — I639 Cerebral infarction, unspecified: Secondary | ICD-10-CM | POA: Diagnosis not present

## 2017-12-10 DIAGNOSIS — R42 Dizziness and giddiness: Secondary | ICD-10-CM | POA: Diagnosis not present

## 2017-12-10 DIAGNOSIS — E782 Mixed hyperlipidemia: Secondary | ICD-10-CM | POA: Diagnosis not present

## 2017-12-10 DIAGNOSIS — R600 Localized edema: Secondary | ICD-10-CM | POA: Diagnosis not present

## 2017-12-10 DIAGNOSIS — F419 Anxiety disorder, unspecified: Secondary | ICD-10-CM | POA: Diagnosis not present

## 2017-12-10 DIAGNOSIS — R0602 Shortness of breath: Secondary | ICD-10-CM | POA: Diagnosis not present

## 2017-12-10 DIAGNOSIS — I480 Paroxysmal atrial fibrillation: Secondary | ICD-10-CM | POA: Diagnosis not present

## 2017-12-10 DIAGNOSIS — I10 Essential (primary) hypertension: Secondary | ICD-10-CM | POA: Diagnosis not present

## 2017-12-10 DIAGNOSIS — R011 Cardiac murmur, unspecified: Secondary | ICD-10-CM | POA: Diagnosis not present

## 2017-12-10 DIAGNOSIS — G510 Bell's palsy: Secondary | ICD-10-CM | POA: Diagnosis not present

## 2017-12-10 DIAGNOSIS — Z7689 Persons encountering health services in other specified circumstances: Secondary | ICD-10-CM | POA: Diagnosis not present

## 2017-12-22 DIAGNOSIS — I10 Essential (primary) hypertension: Secondary | ICD-10-CM | POA: Diagnosis not present

## 2017-12-22 DIAGNOSIS — F419 Anxiety disorder, unspecified: Secondary | ICD-10-CM | POA: Diagnosis not present

## 2017-12-22 DIAGNOSIS — Z8669 Personal history of other diseases of the nervous system and sense organs: Secondary | ICD-10-CM | POA: Diagnosis not present

## 2017-12-22 DIAGNOSIS — E785 Hyperlipidemia, unspecified: Secondary | ICD-10-CM | POA: Diagnosis not present

## 2017-12-22 DIAGNOSIS — I639 Cerebral infarction, unspecified: Secondary | ICD-10-CM | POA: Diagnosis not present

## 2017-12-22 DIAGNOSIS — Z0000 Encounter for general adult medical examination without abnormal findings: Secondary | ICD-10-CM | POA: Diagnosis not present

## 2017-12-22 DIAGNOSIS — I482 Chronic atrial fibrillation: Secondary | ICD-10-CM | POA: Diagnosis not present

## 2017-12-30 DIAGNOSIS — L905 Scar conditions and fibrosis of skin: Secondary | ICD-10-CM | POA: Diagnosis not present

## 2017-12-30 DIAGNOSIS — C44729 Squamous cell carcinoma of skin of left lower limb, including hip: Secondary | ICD-10-CM | POA: Diagnosis not present

## 2018-01-05 DIAGNOSIS — L281 Prurigo nodularis: Secondary | ICD-10-CM | POA: Diagnosis not present

## 2018-01-05 DIAGNOSIS — L570 Actinic keratosis: Secondary | ICD-10-CM | POA: Diagnosis not present

## 2018-01-05 DIAGNOSIS — Z08 Encounter for follow-up examination after completed treatment for malignant neoplasm: Secondary | ICD-10-CM | POA: Diagnosis not present

## 2018-01-05 DIAGNOSIS — D485 Neoplasm of uncertain behavior of skin: Secondary | ICD-10-CM | POA: Diagnosis not present

## 2018-01-05 DIAGNOSIS — L821 Other seborrheic keratosis: Secondary | ICD-10-CM | POA: Diagnosis not present

## 2018-01-05 DIAGNOSIS — X32XXXA Exposure to sunlight, initial encounter: Secondary | ICD-10-CM | POA: Diagnosis not present

## 2018-01-05 DIAGNOSIS — Z85828 Personal history of other malignant neoplasm of skin: Secondary | ICD-10-CM | POA: Diagnosis not present

## 2018-01-05 DIAGNOSIS — L820 Inflamed seborrheic keratosis: Secondary | ICD-10-CM | POA: Diagnosis not present

## 2018-01-05 DIAGNOSIS — C44229 Squamous cell carcinoma of skin of left ear and external auricular canal: Secondary | ICD-10-CM | POA: Diagnosis not present

## 2018-02-24 DIAGNOSIS — C44229 Squamous cell carcinoma of skin of left ear and external auricular canal: Secondary | ICD-10-CM | POA: Diagnosis not present

## 2018-02-24 DIAGNOSIS — L905 Scar conditions and fibrosis of skin: Secondary | ICD-10-CM | POA: Diagnosis not present

## 2018-03-20 ENCOUNTER — Other Ambulatory Visit: Payer: PPO | Admitting: Urology

## 2018-03-20 ENCOUNTER — Encounter: Payer: Self-pay | Admitting: Urology

## 2018-04-12 DIAGNOSIS — S12112A Nondisplaced Type II dens fracture, initial encounter for closed fracture: Secondary | ICD-10-CM | POA: Diagnosis not present

## 2018-04-12 DIAGNOSIS — W01198A Fall on same level from slipping, tripping and stumbling with subsequent striking against other object, initial encounter: Secondary | ICD-10-CM | POA: Diagnosis not present

## 2018-04-12 DIAGNOSIS — S01511A Laceration without foreign body of lip, initial encounter: Secondary | ICD-10-CM | POA: Diagnosis not present

## 2018-04-12 DIAGNOSIS — S12100A Unspecified displaced fracture of second cervical vertebra, initial encounter for closed fracture: Secondary | ICD-10-CM | POA: Diagnosis not present

## 2018-04-12 DIAGNOSIS — Y998 Other external cause status: Secondary | ICD-10-CM | POA: Diagnosis not present

## 2018-04-15 DIAGNOSIS — W01198A Fall on same level from slipping, tripping and stumbling with subsequent striking against other object, initial encounter: Secondary | ICD-10-CM | POA: Diagnosis not present

## 2018-04-15 DIAGNOSIS — Y999 Unspecified external cause status: Secondary | ICD-10-CM | POA: Diagnosis not present

## 2018-04-15 DIAGNOSIS — S0003XA Contusion of scalp, initial encounter: Secondary | ICD-10-CM | POA: Diagnosis not present

## 2018-05-18 DIAGNOSIS — C44219 Basal cell carcinoma of skin of left ear and external auricular canal: Secondary | ICD-10-CM | POA: Diagnosis not present

## 2018-05-18 DIAGNOSIS — Z85828 Personal history of other malignant neoplasm of skin: Secondary | ICD-10-CM | POA: Diagnosis not present

## 2018-06-18 DIAGNOSIS — H26491 Other secondary cataract, right eye: Secondary | ICD-10-CM | POA: Diagnosis not present

## 2018-06-18 DIAGNOSIS — I10 Essential (primary) hypertension: Secondary | ICD-10-CM | POA: Diagnosis not present

## 2018-06-18 DIAGNOSIS — R82998 Other abnormal findings in urine: Secondary | ICD-10-CM | POA: Diagnosis not present

## 2018-06-18 DIAGNOSIS — E7849 Other hyperlipidemia: Secondary | ICD-10-CM | POA: Diagnosis not present

## 2018-06-18 DIAGNOSIS — R7301 Impaired fasting glucose: Secondary | ICD-10-CM | POA: Diagnosis not present

## 2018-06-30 DIAGNOSIS — Z0000 Encounter for general adult medical examination without abnormal findings: Secondary | ICD-10-CM | POA: Diagnosis not present

## 2018-06-30 DIAGNOSIS — Z6827 Body mass index (BMI) 27.0-27.9, adult: Secondary | ICD-10-CM | POA: Diagnosis not present

## 2018-06-30 DIAGNOSIS — Z7901 Long term (current) use of anticoagulants: Secondary | ICD-10-CM | POA: Diagnosis not present

## 2018-06-30 DIAGNOSIS — R7301 Impaired fasting glucose: Secondary | ICD-10-CM | POA: Diagnosis not present

## 2018-06-30 DIAGNOSIS — Z1389 Encounter for screening for other disorder: Secondary | ICD-10-CM | POA: Diagnosis not present

## 2018-06-30 DIAGNOSIS — I10 Essential (primary) hypertension: Secondary | ICD-10-CM | POA: Diagnosis not present

## 2018-06-30 DIAGNOSIS — Z23 Encounter for immunization: Secondary | ICD-10-CM | POA: Diagnosis not present

## 2018-06-30 DIAGNOSIS — I5022 Chronic systolic (congestive) heart failure: Secondary | ICD-10-CM | POA: Diagnosis not present

## 2018-06-30 DIAGNOSIS — R634 Abnormal weight loss: Secondary | ICD-10-CM | POA: Diagnosis not present

## 2018-06-30 DIAGNOSIS — I2510 Atherosclerotic heart disease of native coronary artery without angina pectoris: Secondary | ICD-10-CM | POA: Diagnosis not present

## 2018-06-30 DIAGNOSIS — I480 Paroxysmal atrial fibrillation: Secondary | ICD-10-CM | POA: Diagnosis not present

## 2018-06-30 DIAGNOSIS — E7849 Other hyperlipidemia: Secondary | ICD-10-CM | POA: Diagnosis not present

## 2018-07-01 DIAGNOSIS — C44729 Squamous cell carcinoma of skin of left lower limb, including hip: Secondary | ICD-10-CM | POA: Diagnosis not present

## 2018-07-01 DIAGNOSIS — L570 Actinic keratosis: Secondary | ICD-10-CM | POA: Diagnosis not present

## 2018-07-01 DIAGNOSIS — L820 Inflamed seborrheic keratosis: Secondary | ICD-10-CM | POA: Diagnosis not present

## 2018-07-01 DIAGNOSIS — D485 Neoplasm of uncertain behavior of skin: Secondary | ICD-10-CM | POA: Diagnosis not present

## 2018-07-01 DIAGNOSIS — L281 Prurigo nodularis: Secondary | ICD-10-CM | POA: Diagnosis not present

## 2018-07-01 DIAGNOSIS — Z85828 Personal history of other malignant neoplasm of skin: Secondary | ICD-10-CM | POA: Diagnosis not present

## 2018-07-01 DIAGNOSIS — D0439 Carcinoma in situ of skin of other parts of face: Secondary | ICD-10-CM | POA: Diagnosis not present

## 2018-07-13 DIAGNOSIS — Z85828 Personal history of other malignant neoplasm of skin: Secondary | ICD-10-CM | POA: Diagnosis not present

## 2018-07-13 DIAGNOSIS — C44729 Squamous cell carcinoma of skin of left lower limb, including hip: Secondary | ICD-10-CM | POA: Diagnosis not present

## 2018-07-29 DIAGNOSIS — C44321 Squamous cell carcinoma of skin of nose: Secondary | ICD-10-CM | POA: Diagnosis not present

## 2018-07-29 DIAGNOSIS — Z85828 Personal history of other malignant neoplasm of skin: Secondary | ICD-10-CM | POA: Diagnosis not present

## 2018-08-06 DIAGNOSIS — Z4802 Encounter for removal of sutures: Secondary | ICD-10-CM | POA: Diagnosis not present

## 2018-08-11 DIAGNOSIS — Z6828 Body mass index (BMI) 28.0-28.9, adult: Secondary | ICD-10-CM | POA: Diagnosis not present

## 2018-08-11 DIAGNOSIS — C801 Malignant (primary) neoplasm, unspecified: Secondary | ICD-10-CM | POA: Diagnosis not present

## 2018-08-11 DIAGNOSIS — R634 Abnormal weight loss: Secondary | ICD-10-CM | POA: Diagnosis not present

## 2018-10-05 DIAGNOSIS — I8311 Varicose veins of right lower extremity with inflammation: Secondary | ICD-10-CM | POA: Diagnosis not present

## 2018-10-05 DIAGNOSIS — I8312 Varicose veins of left lower extremity with inflammation: Secondary | ICD-10-CM | POA: Diagnosis not present

## 2018-10-05 DIAGNOSIS — I872 Venous insufficiency (chronic) (peripheral): Secondary | ICD-10-CM | POA: Diagnosis not present

## 2018-10-05 DIAGNOSIS — L570 Actinic keratosis: Secondary | ICD-10-CM | POA: Diagnosis not present

## 2018-10-05 DIAGNOSIS — Z85828 Personal history of other malignant neoplasm of skin: Secondary | ICD-10-CM | POA: Diagnosis not present

## 2018-10-05 DIAGNOSIS — L738 Other specified follicular disorders: Secondary | ICD-10-CM | POA: Diagnosis not present

## 2018-10-05 DIAGNOSIS — L821 Other seborrheic keratosis: Secondary | ICD-10-CM | POA: Diagnosis not present

## 2019-01-07 DIAGNOSIS — L03032 Cellulitis of left toe: Secondary | ICD-10-CM | POA: Diagnosis not present

## 2019-01-26 DIAGNOSIS — K5730 Diverticulosis of large intestine without perforation or abscess without bleeding: Secondary | ICD-10-CM | POA: Diagnosis not present

## 2019-01-26 DIAGNOSIS — R5381 Other malaise: Secondary | ICD-10-CM | POA: Diagnosis not present

## 2019-01-26 DIAGNOSIS — I10 Essential (primary) hypertension: Secondary | ICD-10-CM | POA: Diagnosis not present

## 2019-01-26 DIAGNOSIS — R159 Full incontinence of feces: Secondary | ICD-10-CM | POA: Diagnosis not present

## 2019-01-26 DIAGNOSIS — I8311 Varicose veins of right lower extremity with inflammation: Secondary | ICD-10-CM | POA: Diagnosis not present

## 2019-01-26 DIAGNOSIS — K648 Other hemorrhoids: Secondary | ICD-10-CM | POA: Diagnosis not present

## 2019-01-26 DIAGNOSIS — L400 Psoriasis vulgaris: Secondary | ICD-10-CM | POA: Diagnosis not present

## 2019-01-26 DIAGNOSIS — I8312 Varicose veins of left lower extremity with inflammation: Secondary | ICD-10-CM | POA: Diagnosis not present

## 2019-01-26 DIAGNOSIS — I872 Venous insufficiency (chronic) (peripheral): Secondary | ICD-10-CM | POA: Diagnosis not present

## 2019-01-26 DIAGNOSIS — G4733 Obstructive sleep apnea (adult) (pediatric): Secondary | ICD-10-CM | POA: Diagnosis not present

## 2019-01-26 DIAGNOSIS — L570 Actinic keratosis: Secondary | ICD-10-CM | POA: Diagnosis not present

## 2019-01-26 DIAGNOSIS — M3320 Polymyositis, organ involvement unspecified: Secondary | ICD-10-CM | POA: Diagnosis not present

## 2019-01-26 DIAGNOSIS — R109 Unspecified abdominal pain: Secondary | ICD-10-CM | POA: Diagnosis not present

## 2019-01-26 DIAGNOSIS — L821 Other seborrheic keratosis: Secondary | ICD-10-CM | POA: Diagnosis not present

## 2019-01-26 DIAGNOSIS — Z20828 Contact with and (suspected) exposure to other viral communicable diseases: Secondary | ICD-10-CM | POA: Diagnosis not present

## 2019-01-26 DIAGNOSIS — K529 Noninfective gastroenteritis and colitis, unspecified: Secondary | ICD-10-CM | POA: Diagnosis not present

## 2019-01-26 DIAGNOSIS — C44729 Squamous cell carcinoma of skin of left lower limb, including hip: Secondary | ICD-10-CM | POA: Diagnosis not present

## 2019-01-26 DIAGNOSIS — Z66 Do not resuscitate: Secondary | ICD-10-CM | POA: Diagnosis not present

## 2019-01-26 DIAGNOSIS — Z85828 Personal history of other malignant neoplasm of skin: Secondary | ICD-10-CM | POA: Diagnosis not present

## 2019-01-26 DIAGNOSIS — S2231XA Fracture of one rib, right side, initial encounter for closed fracture: Secondary | ICD-10-CM | POA: Diagnosis not present

## 2019-01-26 DIAGNOSIS — N3941 Urge incontinence: Secondary | ICD-10-CM | POA: Diagnosis not present

## 2019-01-26 DIAGNOSIS — L820 Inflamed seborrheic keratosis: Secondary | ICD-10-CM | POA: Diagnosis not present

## 2019-02-16 DIAGNOSIS — I130 Hypertensive heart and chronic kidney disease with heart failure and stage 1 through stage 4 chronic kidney disease, or unspecified chronic kidney disease: Secondary | ICD-10-CM | POA: Diagnosis not present

## 2019-02-16 DIAGNOSIS — R7301 Impaired fasting glucose: Secondary | ICD-10-CM | POA: Diagnosis not present

## 2019-02-16 DIAGNOSIS — Z7901 Long term (current) use of anticoagulants: Secondary | ICD-10-CM | POA: Diagnosis not present

## 2019-02-16 DIAGNOSIS — F325 Major depressive disorder, single episode, in full remission: Secondary | ICD-10-CM | POA: Diagnosis not present

## 2019-02-16 DIAGNOSIS — I480 Paroxysmal atrial fibrillation: Secondary | ICD-10-CM | POA: Diagnosis not present

## 2019-02-16 DIAGNOSIS — D696 Thrombocytopenia, unspecified: Secondary | ICD-10-CM | POA: Diagnosis not present

## 2019-02-16 DIAGNOSIS — E785 Hyperlipidemia, unspecified: Secondary | ICD-10-CM | POA: Diagnosis not present

## 2019-02-16 DIAGNOSIS — I2510 Atherosclerotic heart disease of native coronary artery without angina pectoris: Secondary | ICD-10-CM | POA: Diagnosis not present

## 2019-02-16 DIAGNOSIS — C801 Malignant (primary) neoplasm, unspecified: Secondary | ICD-10-CM | POA: Diagnosis not present

## 2019-02-16 DIAGNOSIS — N183 Chronic kidney disease, stage 3 (moderate): Secondary | ICD-10-CM | POA: Diagnosis not present

## 2019-02-16 DIAGNOSIS — I5022 Chronic systolic (congestive) heart failure: Secondary | ICD-10-CM | POA: Diagnosis not present

## 2019-04-05 DIAGNOSIS — Z85828 Personal history of other malignant neoplasm of skin: Secondary | ICD-10-CM | POA: Diagnosis not present

## 2019-04-05 DIAGNOSIS — I8312 Varicose veins of left lower extremity with inflammation: Secondary | ICD-10-CM | POA: Diagnosis not present

## 2019-04-05 DIAGNOSIS — I872 Venous insufficiency (chronic) (peripheral): Secondary | ICD-10-CM | POA: Diagnosis not present

## 2019-04-05 DIAGNOSIS — I8311 Varicose veins of right lower extremity with inflammation: Secondary | ICD-10-CM | POA: Diagnosis not present

## 2019-06-25 DIAGNOSIS — R52 Pain, unspecified: Secondary | ICD-10-CM | POA: Diagnosis not present

## 2019-06-25 DIAGNOSIS — Y998 Other external cause status: Secondary | ICD-10-CM | POA: Diagnosis not present

## 2019-06-25 DIAGNOSIS — W01198A Fall on same level from slipping, tripping and stumbling with subsequent striking against other object, initial encounter: Secondary | ICD-10-CM | POA: Diagnosis not present

## 2019-06-25 DIAGNOSIS — M25551 Pain in right hip: Secondary | ICD-10-CM | POA: Diagnosis not present

## 2019-06-25 DIAGNOSIS — M25559 Pain in unspecified hip: Secondary | ICD-10-CM | POA: Diagnosis not present

## 2019-06-25 DIAGNOSIS — S3993XA Unspecified injury of pelvis, initial encounter: Secondary | ICD-10-CM | POA: Diagnosis not present

## 2019-06-25 DIAGNOSIS — I517 Cardiomegaly: Secondary | ICD-10-CM | POA: Diagnosis not present

## 2019-06-25 DIAGNOSIS — J811 Chronic pulmonary edema: Secondary | ICD-10-CM | POA: Diagnosis not present

## 2019-06-25 DIAGNOSIS — I6782 Cerebral ischemia: Secondary | ICD-10-CM | POA: Diagnosis not present

## 2019-06-25 DIAGNOSIS — R29810 Facial weakness: Secondary | ICD-10-CM | POA: Diagnosis not present

## 2019-06-25 DIAGNOSIS — I447 Left bundle-branch block, unspecified: Secondary | ICD-10-CM | POA: Diagnosis not present

## 2019-06-25 DIAGNOSIS — Z20828 Contact with and (suspected) exposure to other viral communicable diseases: Secondary | ICD-10-CM | POA: Diagnosis not present

## 2019-06-25 DIAGNOSIS — M25572 Pain in left ankle and joints of left foot: Secondary | ICD-10-CM | POA: Diagnosis not present

## 2019-06-25 DIAGNOSIS — S72331A Displaced oblique fracture of shaft of right femur, initial encounter for closed fracture: Secondary | ICD-10-CM | POA: Diagnosis not present

## 2019-06-25 DIAGNOSIS — S728X1A Other fracture of right femur, initial encounter for closed fracture: Secondary | ICD-10-CM | POA: Diagnosis not present

## 2019-06-25 DIAGNOSIS — R404 Transient alteration of awareness: Secondary | ICD-10-CM | POA: Diagnosis not present

## 2019-06-25 DIAGNOSIS — Z7901 Long term (current) use of anticoagulants: Secondary | ICD-10-CM | POA: Diagnosis not present

## 2019-06-25 DIAGNOSIS — R0902 Hypoxemia: Secondary | ICD-10-CM | POA: Diagnosis not present

## 2019-06-25 DIAGNOSIS — I4891 Unspecified atrial fibrillation: Secondary | ICD-10-CM | POA: Diagnosis not present

## 2019-06-26 DIAGNOSIS — Z043 Encounter for examination and observation following other accident: Secondary | ICD-10-CM | POA: Diagnosis not present

## 2019-06-26 DIAGNOSIS — M6281 Muscle weakness (generalized): Secondary | ICD-10-CM | POA: Diagnosis not present

## 2019-06-26 DIAGNOSIS — N189 Chronic kidney disease, unspecified: Secondary | ICD-10-CM | POA: Diagnosis not present

## 2019-06-26 DIAGNOSIS — R2681 Unsteadiness on feet: Secondary | ICD-10-CM | POA: Diagnosis not present

## 2019-06-26 DIAGNOSIS — G8918 Other acute postprocedural pain: Secondary | ICD-10-CM | POA: Diagnosis not present

## 2019-06-26 DIAGNOSIS — Z20828 Contact with and (suspected) exposure to other viral communicable diseases: Secondary | ICD-10-CM | POA: Diagnosis not present

## 2019-06-26 DIAGNOSIS — I517 Cardiomegaly: Secondary | ICD-10-CM | POA: Diagnosis not present

## 2019-06-26 DIAGNOSIS — Z7189 Other specified counseling: Secondary | ICD-10-CM | POA: Diagnosis not present

## 2019-06-26 DIAGNOSIS — K8020 Calculus of gallbladder without cholecystitis without obstruction: Secondary | ICD-10-CM | POA: Diagnosis not present

## 2019-06-26 DIAGNOSIS — E872 Acidosis: Secondary | ICD-10-CM | POA: Diagnosis not present

## 2019-06-26 DIAGNOSIS — Z96643 Presence of artificial hip joint, bilateral: Secondary | ICD-10-CM | POA: Diagnosis not present

## 2019-06-26 DIAGNOSIS — I482 Chronic atrial fibrillation, unspecified: Secondary | ICD-10-CM | POA: Diagnosis not present

## 2019-06-26 DIAGNOSIS — I110 Hypertensive heart disease with heart failure: Secondary | ICD-10-CM | POA: Diagnosis not present

## 2019-06-26 DIAGNOSIS — I491 Atrial premature depolarization: Secondary | ICD-10-CM | POA: Diagnosis not present

## 2019-06-26 DIAGNOSIS — S72141A Displaced intertrochanteric fracture of right femur, initial encounter for closed fracture: Secondary | ICD-10-CM | POA: Diagnosis not present

## 2019-06-26 DIAGNOSIS — R1312 Dysphagia, oropharyngeal phase: Secondary | ICD-10-CM | POA: Diagnosis not present

## 2019-06-26 DIAGNOSIS — Z96641 Presence of right artificial hip joint: Secondary | ICD-10-CM | POA: Diagnosis not present

## 2019-06-26 DIAGNOSIS — R918 Other nonspecific abnormal finding of lung field: Secondary | ICD-10-CM | POA: Diagnosis not present

## 2019-06-26 DIAGNOSIS — D696 Thrombocytopenia, unspecified: Secondary | ICD-10-CM | POA: Diagnosis not present

## 2019-06-26 DIAGNOSIS — F0390 Unspecified dementia without behavioral disturbance: Secondary | ICD-10-CM | POA: Diagnosis not present

## 2019-06-26 DIAGNOSIS — W19XXXD Unspecified fall, subsequent encounter: Secondary | ICD-10-CM | POA: Diagnosis not present

## 2019-06-26 DIAGNOSIS — Z4889 Encounter for other specified surgical aftercare: Secondary | ICD-10-CM | POA: Diagnosis not present

## 2019-06-26 DIAGNOSIS — Z539 Procedure and treatment not carried out, unspecified reason: Secondary | ICD-10-CM | POA: Diagnosis not present

## 2019-06-26 DIAGNOSIS — J984 Other disorders of lung: Secondary | ICD-10-CM | POA: Diagnosis not present

## 2019-06-26 DIAGNOSIS — R9431 Abnormal electrocardiogram [ECG] [EKG]: Secondary | ICD-10-CM | POA: Diagnosis not present

## 2019-06-26 DIAGNOSIS — F05 Delirium due to known physiological condition: Secondary | ICD-10-CM | POA: Diagnosis not present

## 2019-06-26 DIAGNOSIS — R41841 Cognitive communication deficit: Secondary | ICD-10-CM | POA: Diagnosis not present

## 2019-06-26 DIAGNOSIS — M978XXD Periprosthetic fracture around other internal prosthetic joint, subsequent encounter: Secondary | ICD-10-CM | POA: Diagnosis not present

## 2019-06-26 DIAGNOSIS — Y998 Other external cause status: Secondary | ICD-10-CM | POA: Diagnosis not present

## 2019-06-26 DIAGNOSIS — I130 Hypertensive heart and chronic kidney disease with heart failure and stage 1 through stage 4 chronic kidney disease, or unspecified chronic kidney disease: Secondary | ICD-10-CM | POA: Diagnosis not present

## 2019-06-26 DIAGNOSIS — R410 Disorientation, unspecified: Secondary | ICD-10-CM | POA: Diagnosis not present

## 2019-06-26 DIAGNOSIS — I509 Heart failure, unspecified: Secondary | ICD-10-CM | POA: Diagnosis not present

## 2019-06-26 DIAGNOSIS — K828 Other specified diseases of gallbladder: Secondary | ICD-10-CM | POA: Diagnosis not present

## 2019-06-26 DIAGNOSIS — W01198A Fall on same level from slipping, tripping and stumbling with subsequent striking against other object, initial encounter: Secondary | ICD-10-CM | POA: Diagnosis not present

## 2019-06-26 DIAGNOSIS — S60211A Contusion of right wrist, initial encounter: Secondary | ICD-10-CM | POA: Diagnosis not present

## 2019-06-26 DIAGNOSIS — M978XXA Periprosthetic fracture around other internal prosthetic joint, initial encounter: Secondary | ICD-10-CM | POA: Diagnosis not present

## 2019-06-26 DIAGNOSIS — M9701XA Periprosthetic fracture around internal prosthetic right hip joint, initial encounter: Secondary | ICD-10-CM | POA: Diagnosis not present

## 2019-06-26 DIAGNOSIS — Z9181 History of falling: Secondary | ICD-10-CM | POA: Diagnosis not present

## 2019-06-26 DIAGNOSIS — M9701XD Periprosthetic fracture around internal prosthetic right hip joint, subsequent encounter: Secondary | ICD-10-CM | POA: Diagnosis not present

## 2019-06-26 DIAGNOSIS — H409 Unspecified glaucoma: Secondary | ICD-10-CM | POA: Diagnosis not present

## 2019-06-26 DIAGNOSIS — R0902 Hypoxemia: Secondary | ICD-10-CM | POA: Diagnosis not present

## 2019-06-26 DIAGNOSIS — F419 Anxiety disorder, unspecified: Secondary | ICD-10-CM | POA: Diagnosis not present

## 2019-06-26 DIAGNOSIS — R531 Weakness: Secondary | ICD-10-CM | POA: Diagnosis not present

## 2019-06-26 DIAGNOSIS — S72391A Other fracture of shaft of right femur, initial encounter for closed fracture: Secondary | ICD-10-CM | POA: Diagnosis not present

## 2019-06-26 DIAGNOSIS — E785 Hyperlipidemia, unspecified: Secondary | ICD-10-CM | POA: Diagnosis not present

## 2019-06-26 DIAGNOSIS — W1839XA Other fall on same level, initial encounter: Secondary | ICD-10-CM | POA: Diagnosis not present

## 2019-06-26 DIAGNOSIS — Z452 Encounter for adjustment and management of vascular access device: Secondary | ICD-10-CM | POA: Diagnosis not present

## 2019-06-26 DIAGNOSIS — S7291XA Unspecified fracture of right femur, initial encounter for closed fracture: Secondary | ICD-10-CM | POA: Diagnosis not present

## 2019-06-26 DIAGNOSIS — Z7901 Long term (current) use of anticoagulants: Secondary | ICD-10-CM | POA: Diagnosis not present

## 2019-06-26 DIAGNOSIS — E871 Hypo-osmolality and hyponatremia: Secondary | ICD-10-CM | POA: Diagnosis not present

## 2019-06-26 DIAGNOSIS — Z515 Encounter for palliative care: Secondary | ICD-10-CM | POA: Diagnosis not present

## 2019-06-26 DIAGNOSIS — F339 Major depressive disorder, recurrent, unspecified: Secondary | ICD-10-CM | POA: Diagnosis not present

## 2019-06-26 DIAGNOSIS — I5042 Chronic combined systolic (congestive) and diastolic (congestive) heart failure: Secondary | ICD-10-CM | POA: Diagnosis not present

## 2019-06-26 DIAGNOSIS — I2510 Atherosclerotic heart disease of native coronary artery without angina pectoris: Secondary | ICD-10-CM | POA: Diagnosis not present

## 2019-06-26 DIAGNOSIS — Z01818 Encounter for other preprocedural examination: Secondary | ICD-10-CM | POA: Diagnosis not present

## 2019-06-26 DIAGNOSIS — Z01812 Encounter for preprocedural laboratory examination: Secondary | ICD-10-CM | POA: Diagnosis not present

## 2019-06-26 DIAGNOSIS — W19XXXA Unspecified fall, initial encounter: Secondary | ICD-10-CM | POA: Diagnosis not present

## 2019-06-26 DIAGNOSIS — I447 Left bundle-branch block, unspecified: Secondary | ICD-10-CM | POA: Diagnosis not present

## 2019-06-26 DIAGNOSIS — D62 Acute posthemorrhagic anemia: Secondary | ICD-10-CM | POA: Diagnosis not present

## 2019-06-26 DIAGNOSIS — F329 Major depressive disorder, single episode, unspecified: Secondary | ICD-10-CM | POA: Diagnosis not present

## 2019-06-26 DIAGNOSIS — N170 Acute kidney failure with tubular necrosis: Secondary | ICD-10-CM | POA: Diagnosis not present

## 2019-06-26 DIAGNOSIS — M80051A Age-related osteoporosis with current pathological fracture, right femur, initial encounter for fracture: Secondary | ICD-10-CM | POA: Diagnosis not present

## 2019-06-26 DIAGNOSIS — Z66 Do not resuscitate: Secondary | ICD-10-CM | POA: Diagnosis not present

## 2019-06-26 DIAGNOSIS — E875 Hyperkalemia: Secondary | ICD-10-CM | POA: Diagnosis not present

## 2019-06-26 DIAGNOSIS — I10 Essential (primary) hypertension: Secondary | ICD-10-CM | POA: Diagnosis not present

## 2019-06-26 DIAGNOSIS — E878 Other disorders of electrolyte and fluid balance, not elsewhere classified: Secondary | ICD-10-CM | POA: Diagnosis not present

## 2019-06-26 DIAGNOSIS — T1490XA Injury, unspecified, initial encounter: Secondary | ICD-10-CM | POA: Diagnosis not present

## 2019-06-26 DIAGNOSIS — N179 Acute kidney failure, unspecified: Secondary | ICD-10-CM | POA: Diagnosis not present

## 2019-06-26 DIAGNOSIS — I493 Ventricular premature depolarization: Secondary | ICD-10-CM | POA: Diagnosis not present

## 2019-06-26 DIAGNOSIS — I4891 Unspecified atrial fibrillation: Secondary | ICD-10-CM | POA: Diagnosis not present

## 2019-06-26 DIAGNOSIS — I4519 Other right bundle-branch block: Secondary | ICD-10-CM | POA: Diagnosis not present

## 2019-06-26 DIAGNOSIS — K5900 Constipation, unspecified: Secondary | ICD-10-CM | POA: Diagnosis not present

## 2019-06-26 DIAGNOSIS — R488 Other symbolic dysfunctions: Secondary | ICD-10-CM | POA: Diagnosis not present

## 2019-06-26 DIAGNOSIS — R279 Unspecified lack of coordination: Secondary | ICD-10-CM | POA: Diagnosis not present

## 2019-06-26 DIAGNOSIS — F13231 Sedative, hypnotic or anxiolytic dependence with withdrawal delirium: Secondary | ICD-10-CM | POA: Diagnosis not present

## 2019-06-26 DIAGNOSIS — R000 Tachycardia, unspecified: Secondary | ICD-10-CM | POA: Diagnosis not present

## 2019-06-26 DIAGNOSIS — I480 Paroxysmal atrial fibrillation: Secondary | ICD-10-CM | POA: Diagnosis not present

## 2019-06-26 DIAGNOSIS — I252 Old myocardial infarction: Secondary | ICD-10-CM | POA: Diagnosis not present

## 2019-06-26 DIAGNOSIS — G4700 Insomnia, unspecified: Secondary | ICD-10-CM | POA: Diagnosis not present

## 2019-06-26 DIAGNOSIS — M9701XS Periprosthetic fracture around internal prosthetic right hip joint, sequela: Secondary | ICD-10-CM | POA: Diagnosis not present

## 2019-06-26 DIAGNOSIS — Z743 Need for continuous supervision: Secondary | ICD-10-CM | POA: Diagnosis not present

## 2019-07-06 DIAGNOSIS — I491 Atrial premature depolarization: Secondary | ICD-10-CM | POA: Diagnosis not present

## 2019-07-06 DIAGNOSIS — R279 Unspecified lack of coordination: Secondary | ICD-10-CM | POA: Diagnosis not present

## 2019-07-06 DIAGNOSIS — J189 Pneumonia, unspecified organism: Secondary | ICD-10-CM | POA: Diagnosis not present

## 2019-07-06 DIAGNOSIS — R410 Disorientation, unspecified: Secondary | ICD-10-CM | POA: Diagnosis not present

## 2019-07-06 DIAGNOSIS — F329 Major depressive disorder, single episode, unspecified: Secondary | ICD-10-CM | POA: Diagnosis not present

## 2019-07-06 DIAGNOSIS — Z7189 Other specified counseling: Secondary | ICD-10-CM | POA: Diagnosis not present

## 2019-07-06 DIAGNOSIS — Z4889 Encounter for other specified surgical aftercare: Secondary | ICD-10-CM | POA: Diagnosis not present

## 2019-07-06 DIAGNOSIS — N179 Acute kidney failure, unspecified: Secondary | ICD-10-CM | POA: Diagnosis not present

## 2019-07-06 DIAGNOSIS — R339 Retention of urine, unspecified: Secondary | ICD-10-CM | POA: Diagnosis not present

## 2019-07-06 DIAGNOSIS — M9701XA Periprosthetic fracture around internal prosthetic right hip joint, initial encounter: Secondary | ICD-10-CM | POA: Diagnosis not present

## 2019-07-06 DIAGNOSIS — Z79899 Other long term (current) drug therapy: Secondary | ICD-10-CM | POA: Diagnosis not present

## 2019-07-06 DIAGNOSIS — R488 Other symbolic dysfunctions: Secondary | ICD-10-CM | POA: Diagnosis not present

## 2019-07-06 DIAGNOSIS — Z20828 Contact with and (suspected) exposure to other viral communicable diseases: Secondary | ICD-10-CM | POA: Diagnosis not present

## 2019-07-06 DIAGNOSIS — F0390 Unspecified dementia without behavioral disturbance: Secondary | ICD-10-CM | POA: Diagnosis not present

## 2019-07-06 DIAGNOSIS — M978XXD Periprosthetic fracture around other internal prosthetic joint, subsequent encounter: Secondary | ICD-10-CM | POA: Diagnosis not present

## 2019-07-06 DIAGNOSIS — R531 Weakness: Secondary | ICD-10-CM | POA: Diagnosis not present

## 2019-07-06 DIAGNOSIS — R05 Cough: Secondary | ICD-10-CM | POA: Diagnosis not present

## 2019-07-06 DIAGNOSIS — W19XXXA Unspecified fall, initial encounter: Secondary | ICD-10-CM | POA: Diagnosis not present

## 2019-07-06 DIAGNOSIS — I480 Paroxysmal atrial fibrillation: Secondary | ICD-10-CM | POA: Diagnosis not present

## 2019-07-06 DIAGNOSIS — R0902 Hypoxemia: Secondary | ICD-10-CM | POA: Diagnosis not present

## 2019-07-06 DIAGNOSIS — U071 COVID-19: Secondary | ICD-10-CM | POA: Diagnosis not present

## 2019-07-06 DIAGNOSIS — F419 Anxiety disorder, unspecified: Secondary | ICD-10-CM | POA: Diagnosis not present

## 2019-07-06 DIAGNOSIS — Z743 Need for continuous supervision: Secondary | ICD-10-CM | POA: Diagnosis not present

## 2019-07-06 DIAGNOSIS — Z515 Encounter for palliative care: Secondary | ICD-10-CM | POA: Diagnosis not present

## 2019-07-06 DIAGNOSIS — G4700 Insomnia, unspecified: Secondary | ICD-10-CM | POA: Diagnosis not present

## 2019-07-06 DIAGNOSIS — I10 Essential (primary) hypertension: Secondary | ICD-10-CM | POA: Diagnosis not present

## 2019-07-06 DIAGNOSIS — I4891 Unspecified atrial fibrillation: Secondary | ICD-10-CM | POA: Diagnosis not present

## 2019-07-06 DIAGNOSIS — R41841 Cognitive communication deficit: Secondary | ICD-10-CM | POA: Diagnosis not present

## 2019-07-06 DIAGNOSIS — F13231 Sedative, hypnotic or anxiolytic dependence with withdrawal delirium: Secondary | ICD-10-CM | POA: Diagnosis not present

## 2019-07-06 DIAGNOSIS — J069 Acute upper respiratory infection, unspecified: Secondary | ICD-10-CM | POA: Diagnosis not present

## 2019-07-06 DIAGNOSIS — Z4789 Encounter for other orthopedic aftercare: Secondary | ICD-10-CM | POA: Diagnosis not present

## 2019-07-06 DIAGNOSIS — D649 Anemia, unspecified: Secondary | ICD-10-CM | POA: Diagnosis not present

## 2019-07-06 DIAGNOSIS — R2681 Unsteadiness on feet: Secondary | ICD-10-CM | POA: Diagnosis not present

## 2019-07-06 DIAGNOSIS — I2510 Atherosclerotic heart disease of native coronary artery without angina pectoris: Secondary | ICD-10-CM | POA: Diagnosis not present

## 2019-07-06 DIAGNOSIS — Z23 Encounter for immunization: Secondary | ICD-10-CM | POA: Diagnosis not present

## 2019-07-06 DIAGNOSIS — R1312 Dysphagia, oropharyngeal phase: Secondary | ICD-10-CM | POA: Diagnosis not present

## 2019-07-06 DIAGNOSIS — M6281 Muscle weakness (generalized): Secondary | ICD-10-CM | POA: Diagnosis not present

## 2019-07-06 DIAGNOSIS — M9701XS Periprosthetic fracture around internal prosthetic right hip joint, sequela: Secondary | ICD-10-CM | POA: Diagnosis not present

## 2019-07-06 DIAGNOSIS — Z4802 Encounter for removal of sutures: Secondary | ICD-10-CM | POA: Diagnosis not present

## 2019-07-13 DIAGNOSIS — R0902 Hypoxemia: Secondary | ICD-10-CM | POA: Diagnosis not present

## 2019-07-13 DIAGNOSIS — N179 Acute kidney failure, unspecified: Secondary | ICD-10-CM | POA: Diagnosis not present

## 2019-07-13 DIAGNOSIS — J069 Acute upper respiratory infection, unspecified: Secondary | ICD-10-CM | POA: Diagnosis not present

## 2019-07-13 DIAGNOSIS — D649 Anemia, unspecified: Secondary | ICD-10-CM | POA: Diagnosis not present

## 2019-07-13 DIAGNOSIS — M978XXD Periprosthetic fracture around other internal prosthetic joint, subsequent encounter: Secondary | ICD-10-CM | POA: Diagnosis not present

## 2019-07-13 DIAGNOSIS — Z79899 Other long term (current) drug therapy: Secondary | ICD-10-CM | POA: Diagnosis not present

## 2019-07-14 DIAGNOSIS — Z20828 Contact with and (suspected) exposure to other viral communicable diseases: Secondary | ICD-10-CM | POA: Diagnosis not present

## 2019-07-14 DIAGNOSIS — U071 COVID-19: Secondary | ICD-10-CM | POA: Diagnosis not present

## 2019-07-14 DIAGNOSIS — R05 Cough: Secondary | ICD-10-CM | POA: Diagnosis not present

## 2019-07-15 DIAGNOSIS — Z4802 Encounter for removal of sutures: Secondary | ICD-10-CM | POA: Diagnosis not present

## 2019-07-15 DIAGNOSIS — Z4789 Encounter for other orthopedic aftercare: Secondary | ICD-10-CM | POA: Diagnosis not present

## 2019-07-19 DIAGNOSIS — Z79899 Other long term (current) drug therapy: Secondary | ICD-10-CM | POA: Diagnosis not present

## 2019-07-19 DIAGNOSIS — D649 Anemia, unspecified: Secondary | ICD-10-CM | POA: Diagnosis not present

## 2019-07-20 DIAGNOSIS — J189 Pneumonia, unspecified organism: Secondary | ICD-10-CM | POA: Diagnosis not present

## 2019-07-20 DIAGNOSIS — R339 Retention of urine, unspecified: Secondary | ICD-10-CM | POA: Diagnosis not present

## 2019-07-20 DIAGNOSIS — N179 Acute kidney failure, unspecified: Secondary | ICD-10-CM | POA: Diagnosis not present

## 2019-07-20 DIAGNOSIS — M978XXD Periprosthetic fracture around other internal prosthetic joint, subsequent encounter: Secondary | ICD-10-CM | POA: Diagnosis not present

## 2019-07-21 DIAGNOSIS — Z20828 Contact with and (suspected) exposure to other viral communicable diseases: Secondary | ICD-10-CM | POA: Diagnosis not present

## 2019-07-22 DIAGNOSIS — G4700 Insomnia, unspecified: Secondary | ICD-10-CM | POA: Diagnosis not present

## 2019-07-22 DIAGNOSIS — I2510 Atherosclerotic heart disease of native coronary artery without angina pectoris: Secondary | ICD-10-CM | POA: Diagnosis not present

## 2019-07-22 DIAGNOSIS — I4891 Unspecified atrial fibrillation: Secondary | ICD-10-CM | POA: Diagnosis not present

## 2019-07-22 DIAGNOSIS — M978XXD Periprosthetic fracture around other internal prosthetic joint, subsequent encounter: Secondary | ICD-10-CM | POA: Diagnosis not present

## 2019-07-23 DIAGNOSIS — Z79899 Other long term (current) drug therapy: Secondary | ICD-10-CM | POA: Diagnosis not present

## 2019-07-23 DIAGNOSIS — I4891 Unspecified atrial fibrillation: Secondary | ICD-10-CM | POA: Diagnosis not present

## 2019-07-23 DIAGNOSIS — D649 Anemia, unspecified: Secondary | ICD-10-CM | POA: Diagnosis not present

## 2019-07-26 DIAGNOSIS — D649 Anemia, unspecified: Secondary | ICD-10-CM | POA: Diagnosis not present

## 2019-07-26 DIAGNOSIS — I10 Essential (primary) hypertension: Secondary | ICD-10-CM | POA: Diagnosis not present

## 2019-07-27 DIAGNOSIS — M978XXD Periprosthetic fracture around other internal prosthetic joint, subsequent encounter: Secondary | ICD-10-CM | POA: Diagnosis not present

## 2019-07-27 DIAGNOSIS — F329 Major depressive disorder, single episode, unspecified: Secondary | ICD-10-CM | POA: Diagnosis not present

## 2019-07-27 DIAGNOSIS — I10 Essential (primary) hypertension: Secondary | ICD-10-CM | POA: Diagnosis not present

## 2019-07-27 DIAGNOSIS — G4700 Insomnia, unspecified: Secondary | ICD-10-CM | POA: Diagnosis not present

## 2019-07-27 DIAGNOSIS — I4891 Unspecified atrial fibrillation: Secondary | ICD-10-CM | POA: Diagnosis not present

## 2019-07-27 DIAGNOSIS — F419 Anxiety disorder, unspecified: Secondary | ICD-10-CM | POA: Diagnosis not present

## 2019-07-29 DIAGNOSIS — M978XXD Periprosthetic fracture around other internal prosthetic joint, subsequent encounter: Secondary | ICD-10-CM | POA: Diagnosis not present

## 2019-07-29 DIAGNOSIS — I4891 Unspecified atrial fibrillation: Secondary | ICD-10-CM | POA: Diagnosis not present

## 2019-07-29 DIAGNOSIS — I10 Essential (primary) hypertension: Secondary | ICD-10-CM | POA: Diagnosis not present

## 2019-07-29 DIAGNOSIS — G4700 Insomnia, unspecified: Secondary | ICD-10-CM | POA: Diagnosis not present

## 2019-08-02 DIAGNOSIS — I110 Hypertensive heart disease with heart failure: Secondary | ICD-10-CM | POA: Diagnosis not present

## 2019-08-02 DIAGNOSIS — E785 Hyperlipidemia, unspecified: Secondary | ICD-10-CM | POA: Diagnosis not present

## 2019-08-02 DIAGNOSIS — Z79899 Other long term (current) drug therapy: Secondary | ICD-10-CM | POA: Diagnosis not present

## 2019-08-02 DIAGNOSIS — I272 Pulmonary hypertension, unspecified: Secondary | ICD-10-CM | POA: Diagnosis not present

## 2019-08-02 DIAGNOSIS — E872 Acidosis: Secondary | ICD-10-CM | POA: Diagnosis not present

## 2019-08-02 DIAGNOSIS — I2510 Atherosclerotic heart disease of native coronary artery without angina pectoris: Secondary | ICD-10-CM | POA: Diagnosis not present

## 2019-08-02 DIAGNOSIS — Z7901 Long term (current) use of anticoagulants: Secondary | ICD-10-CM | POA: Diagnosis not present

## 2019-08-02 DIAGNOSIS — R0902 Hypoxemia: Secondary | ICD-10-CM | POA: Diagnosis not present

## 2019-08-02 DIAGNOSIS — N289 Disorder of kidney and ureter, unspecified: Secondary | ICD-10-CM | POA: Diagnosis not present

## 2019-08-02 DIAGNOSIS — R0602 Shortness of breath: Secondary | ICD-10-CM | POA: Diagnosis not present

## 2019-08-02 DIAGNOSIS — I10 Essential (primary) hypertension: Secondary | ICD-10-CM | POA: Diagnosis not present

## 2019-08-02 DIAGNOSIS — J9601 Acute respiratory failure with hypoxia: Secondary | ICD-10-CM | POA: Diagnosis not present

## 2019-08-02 DIAGNOSIS — D696 Thrombocytopenia, unspecified: Secondary | ICD-10-CM | POA: Diagnosis not present

## 2019-08-02 DIAGNOSIS — I5042 Chronic combined systolic (congestive) and diastolic (congestive) heart failure: Secondary | ICD-10-CM | POA: Diagnosis not present

## 2019-08-02 DIAGNOSIS — I420 Dilated cardiomyopathy: Secondary | ICD-10-CM | POA: Diagnosis not present

## 2019-08-02 DIAGNOSIS — Z7982 Long term (current) use of aspirin: Secondary | ICD-10-CM | POA: Diagnosis not present

## 2019-08-02 DIAGNOSIS — Z66 Do not resuscitate: Secondary | ICD-10-CM | POA: Diagnosis not present

## 2019-08-02 DIAGNOSIS — I371 Nonrheumatic pulmonary valve insufficiency: Secondary | ICD-10-CM | POA: Diagnosis not present

## 2019-08-02 DIAGNOSIS — I447 Left bundle-branch block, unspecified: Secondary | ICD-10-CM | POA: Diagnosis not present

## 2019-08-02 DIAGNOSIS — Z515 Encounter for palliative care: Secondary | ICD-10-CM | POA: Diagnosis not present

## 2019-08-02 DIAGNOSIS — I130 Hypertensive heart and chronic kidney disease with heart failure and stage 1 through stage 4 chronic kidney disease, or unspecified chronic kidney disease: Secondary | ICD-10-CM | POA: Diagnosis not present

## 2019-08-02 DIAGNOSIS — Z96649 Presence of unspecified artificial hip joint: Secondary | ICD-10-CM | POA: Diagnosis not present

## 2019-08-02 DIAGNOSIS — I5021 Acute systolic (congestive) heart failure: Secondary | ICD-10-CM | POA: Diagnosis not present

## 2019-08-02 DIAGNOSIS — R7989 Other specified abnormal findings of blood chemistry: Secondary | ICD-10-CM | POA: Diagnosis not present

## 2019-08-02 DIAGNOSIS — I493 Ventricular premature depolarization: Secondary | ICD-10-CM | POA: Diagnosis not present

## 2019-08-02 DIAGNOSIS — Z8551 Personal history of malignant neoplasm of bladder: Secondary | ICD-10-CM | POA: Diagnosis not present

## 2019-08-02 DIAGNOSIS — R000 Tachycardia, unspecified: Secondary | ICD-10-CM | POA: Diagnosis not present

## 2019-08-02 DIAGNOSIS — Z20828 Contact with and (suspected) exposure to other viral communicable diseases: Secondary | ICD-10-CM | POA: Diagnosis not present

## 2019-08-02 DIAGNOSIS — I509 Heart failure, unspecified: Secondary | ICD-10-CM | POA: Diagnosis not present

## 2019-08-02 DIAGNOSIS — I214 Non-ST elevation (NSTEMI) myocardial infarction: Secondary | ICD-10-CM | POA: Diagnosis not present

## 2019-08-02 DIAGNOSIS — I21A1 Myocardial infarction type 2: Secondary | ICD-10-CM | POA: Diagnosis not present

## 2019-08-02 DIAGNOSIS — I083 Combined rheumatic disorders of mitral, aortic and tricuspid valves: Secondary | ICD-10-CM | POA: Diagnosis not present

## 2019-08-02 DIAGNOSIS — I213 ST elevation (STEMI) myocardial infarction of unspecified site: Secondary | ICD-10-CM | POA: Diagnosis not present

## 2019-08-02 DIAGNOSIS — R0689 Other abnormalities of breathing: Secondary | ICD-10-CM | POA: Diagnosis not present

## 2019-08-02 DIAGNOSIS — I5023 Acute on chronic systolic (congestive) heart failure: Secondary | ICD-10-CM | POA: Diagnosis not present

## 2019-08-02 DIAGNOSIS — N179 Acute kidney failure, unspecified: Secondary | ICD-10-CM | POA: Diagnosis not present

## 2019-08-02 DIAGNOSIS — I482 Chronic atrial fibrillation, unspecified: Secondary | ICD-10-CM | POA: Diagnosis not present

## 2019-08-02 DIAGNOSIS — N189 Chronic kidney disease, unspecified: Secondary | ICD-10-CM | POA: Diagnosis not present

## 2019-08-02 DIAGNOSIS — I252 Old myocardial infarction: Secondary | ICD-10-CM | POA: Diagnosis not present

## 2019-08-02 DIAGNOSIS — Z9889 Other specified postprocedural states: Secondary | ICD-10-CM | POA: Diagnosis not present

## 2019-08-02 DIAGNOSIS — I4891 Unspecified atrial fibrillation: Secondary | ICD-10-CM | POA: Diagnosis not present

## 2019-08-22 DEATH — deceased
